# Patient Record
Sex: Male | Born: 1970 | Race: White | Hispanic: No | Smoking: Former smoker
Health system: Southern US, Community
[De-identification: ages and names within clinical notes are randomized; demographics above are authoritative.]

## PROBLEM LIST (undated history)

## (undated) DIAGNOSIS — G8929 Other chronic pain: Secondary | ICD-10-CM

## (undated) DIAGNOSIS — F32A Depression, unspecified: Secondary | ICD-10-CM

## (undated) DIAGNOSIS — F329 Major depressive disorder, single episode, unspecified: Secondary | ICD-10-CM

## (undated) DIAGNOSIS — M1990 Unspecified osteoarthritis, unspecified site: Secondary | ICD-10-CM

## (undated) DIAGNOSIS — M25559 Pain in unspecified hip: Secondary | ICD-10-CM

---

## 1998-04-12 HISTORY — PX: REFRACTIVE SURGERY: SHX103

## 2008-05-07 ENCOUNTER — Emergency Department (HOSPITAL_BASED_OUTPATIENT_CLINIC_OR_DEPARTMENT_OTHER): Admission: EM | Admit: 2008-05-07 | Discharge: 2008-05-07 | Payer: Self-pay | Admitting: Emergency Medicine

## 2012-08-28 ENCOUNTER — Emergency Department (HOSPITAL_BASED_OUTPATIENT_CLINIC_OR_DEPARTMENT_OTHER): Payer: Medicaid Other

## 2012-08-28 ENCOUNTER — Encounter (HOSPITAL_BASED_OUTPATIENT_CLINIC_OR_DEPARTMENT_OTHER): Payer: Self-pay

## 2012-08-28 ENCOUNTER — Emergency Department (HOSPITAL_BASED_OUTPATIENT_CLINIC_OR_DEPARTMENT_OTHER)
Admission: EM | Admit: 2012-08-28 | Discharge: 2012-08-28 | Disposition: A | Discharge status: Home / Self Care | Payer: Medicaid Other | Attending: Emergency Medicine | Admitting: Emergency Medicine

## 2012-08-28 DIAGNOSIS — M545 Low back pain, unspecified: Secondary | ICD-10-CM | POA: Insufficient documentation

## 2012-08-28 DIAGNOSIS — R109 Unspecified abdominal pain: Secondary | ICD-10-CM | POA: Insufficient documentation

## 2012-08-28 DIAGNOSIS — G8929 Other chronic pain: Secondary | ICD-10-CM | POA: Insufficient documentation

## 2012-08-28 DIAGNOSIS — M25559 Pain in unspecified hip: Secondary | ICD-10-CM | POA: Insufficient documentation

## 2012-08-28 DIAGNOSIS — M549 Dorsalgia, unspecified: Secondary | ICD-10-CM

## 2012-08-28 DIAGNOSIS — N390 Urinary tract infection, site not specified: Secondary | ICD-10-CM | POA: Insufficient documentation

## 2012-08-28 DIAGNOSIS — R350 Frequency of micturition: Secondary | ICD-10-CM | POA: Insufficient documentation

## 2012-08-28 HISTORY — DX: Pain in unspecified hip: M25.559

## 2012-08-28 HISTORY — DX: Other chronic pain: G89.29

## 2012-08-28 LAB — URINALYSIS, ROUTINE W REFLEX MICROSCOPIC
Bilirubin Urine: NEGATIVE
Glucose, UA: NEGATIVE mg/dL
Ketones, ur: NEGATIVE mg/dL
Leukocytes, UA: NEGATIVE
Protein, ur: NEGATIVE mg/dL
pH: 7.5 (ref 5.0–8.0)

## 2012-08-28 LAB — URINE MICROSCOPIC-ADD ON

## 2012-08-28 MED ORDER — OXYCODONE-ACETAMINOPHEN 5-325 MG PO TABS: 2.0000 | ORAL_TABLET | Freq: Three times a day (TID) | ORAL | Status: DC | PRN

## 2012-08-28 NOTE — ED Notes (Signed)
Pt reports low back pain unrelieved after taking Vicodin.  Denies injury.

## 2012-08-28 NOTE — ED Provider Notes (Signed)
History     CSN: [id number]  Arrival date & time 08/28/12  0818   First MD Initiated Contact with Patient 08/28/12 [phone]      Chief Complaint  Patient presents with  . Back Pain    (Consider location/radiation/quality/duration/timing/severity/associated sxs/prior treatment) HPI This 42 year old male has history of chronic bilateral hip pain for which he takes Vicodin but that has been stable, he now presents with a three-day history of sudden onset colicky diffuse lumbar back pain nonradiating without associated weakness numbness or change in bowel or bladder function except for some urinary frequency without hematuria or testicular pain. He also is no fever chest pain shortness breath or abdominal pain nausea vomiting or diarrhea. Treatment prior to arrival consists of 2 extra Vicodin tablets yesterday with as well as ibuprofen which did decrease the pain. The pain is sudden sharp colicky severe but not changing with position or activity. He does not have any history of fever or IV drug abuse HIV cancer or recent trauma. His pain is severe. His pain is not worse with movement or palpation. Past Medical History  Diagnosis Date  . Chronic hip pain     History reviewed. No pertinent past surgical history.  No family history on file.  History  Substance Use Topics  . Smoking status: Never Smoker   . Smokeless tobacco: Not on file  . Alcohol Use: No      Review of Systems 10 Systems reviewed and are negative for acute change except as noted in the HPI. Allergies  Etodolac  Home Medications   Current Outpatient Rx  Name  Route  Sig  Dispense  Refill  . HYDROcodone-acetaminophen (NORCO/VICODIN) 5-325 MG per tablet   Oral   Take 1 tablet by mouth every 6 (six) hours as needed for pain.         Marland Kitchen oxyCODONE-acetaminophen (PERCOCET) 5-325 MG per tablet   Oral   Take 2 tablets by mouth every 8 (eight) hours as needed for pain.   15 tablet   0     BP 162/92  Pulse 82   Temp(Src) 98.3 F (36.8 C) (Oral)  Resp 20  Ht 6\' 1"  (1.854 m)  Wt 243 lb (110.224 kg)  BMI 32.07 kg/m2  SpO2 100%  Physical Exam  Nursing note and vitals reviewed. Constitutional:  Awake, alert, nontoxic appearance with baseline speech.  HENT:  Head: Atraumatic.  Eyes: Pupils are equal, round, and reactive to light. Right eye exhibits no discharge. Left eye exhibits no discharge.  Neck: Neck supple.  Cardiovascular: Normal rate and regular rhythm.   No murmur heard. Pulmonary/Chest: Effort normal and breath sounds normal. No respiratory distress. He has no wheezes. He has no rales. He exhibits no tenderness.  Abdominal: Soft. Bowel sounds are normal. He exhibits no distension and no mass. There is no tenderness. There is no rebound and no guarding.  Genitourinary:  Testicles descended and nontender, no palpable inguinal hernias  Musculoskeletal: He exhibits no edema and no tenderness.       Thoracic back: He exhibits no tenderness.       Lumbar back: He exhibits no tenderness.  Back is nontender including no CVA tenderness and no midline tenderness and no rash. Bilateral lower extremities non tender without new rashes or color change, baseline ROM with intact DP pulses, CR<2 secs all digits bilaterally, sensation baseline light touch bilaterally for pt, DTR's symmetric and intact bilaterally KJ / AJ, motor symmetric bilateral 5 / 5 hip flexion, quadriceps, hamstrings,  EHL, foot dorsiflexion, foot plantarflexion, gait normal.  Neurological: He is alert.  Mental status baseline for patient.  Upper extremity motor strength and sensation intact and symmetric bilaterally.  Skin: No rash noted.  Psychiatric: He has a normal mood and affect.    ED Course  Procedures (including critical care time)  Labs Reviewed  URINALYSIS, ROUTINE W REFLEX MICROSCOPIC - Abnormal; Notable for the following:    Hgb urine dipstick TRACE (*)    All other components within normal limits  URINE CULTURE   URINE MICROSCOPIC-ADD ON   Ct Abdomen Pelvis Wo Contrast  08/28/2012  *RADIOLOGY REPORT*  Clinical Data: Low back pain, groin pain  CT ABDOMEN AND PELVIS WITHOUT CONTRAST  Technique:  Multidetector CT imaging of the abdomen and pelvis was performed following the standard protocol without intravenous contrast.  Comparison: None.  Findings: Lung bases are unremarkable.  Sagittal images of the spine are unremarkable.  Unenhanced liver, spleen, pancreas and adrenals are unremarkable. Unenhanced kidneys are symmetrical in size.  No nephrolithiasis. No hydronephrosis or hydroureter.  No aortic aneurysm.  No calcified gallstones are noted within gallbladder.  No calcified ureteral calculi are noted.  No small bowel obstruction.  No pericecal inflammation.  Normal appendix clearly visualized axial image 60.  Bilateral distal ureter is unremarkable.  Urinary bladder is unremarkable.  No calcified calculi are noted within urinary bladder.  Tiny prostate gland calcification measures 5 mm.  No destructive bony lesions are noted within pelvis.  No inguinal adenopathy.  IMPRESSION:  1.  No nephrolithiasis.  No hydronephrosis or hydroureter. 2.  No calcified ureteral calculi. 3.  Normal appendix. 4.  No small bowel obstruction.   Original Report Authenticated By: Natasha Mead, M.D.      1. Back pain   2. UTI (lower urinary tract infection)   3. Chronic hip pain, unspecified laterality       MDM  Patient informed of clinical course, understand medical decision-making process, and agree with plan. I doubt any other EMC precluding discharge at this time including, but not necessarily limited to the following:SBI, cauda equina.        Hurman Horn, MD 08/28/12 2053

## 2012-08-29 LAB — URINE CULTURE: Colony Count: NO GROWTH

## 2015-04-17 ENCOUNTER — Emergency Department (HOSPITAL_BASED_OUTPATIENT_CLINIC_OR_DEPARTMENT_OTHER)
Admission: EM | Admit: 2015-04-17 | Discharge: 2015-04-18 | Disposition: A | Discharge status: Home / Self Care | Payer: Medicaid Other | Attending: Emergency Medicine | Admitting: Emergency Medicine

## 2015-04-17 ENCOUNTER — Emergency Department (HOSPITAL_BASED_OUTPATIENT_CLINIC_OR_DEPARTMENT_OTHER): Payer: Medicaid Other

## 2015-04-17 ENCOUNTER — Encounter (HOSPITAL_BASED_OUTPATIENT_CLINIC_OR_DEPARTMENT_OTHER): Payer: Self-pay | Admitting: *Deleted

## 2015-04-17 DIAGNOSIS — S79911A Unspecified injury of right hip, initial encounter: Secondary | ICD-10-CM | POA: Insufficient documentation

## 2015-04-17 DIAGNOSIS — S3992XA Unspecified injury of lower back, initial encounter: Secondary | ICD-10-CM | POA: Insufficient documentation

## 2015-04-17 DIAGNOSIS — S199XXA Unspecified injury of neck, initial encounter: Secondary | ICD-10-CM | POA: Insufficient documentation

## 2015-04-17 DIAGNOSIS — Y92410 Unspecified street and highway as the place of occurrence of the external cause: Secondary | ICD-10-CM | POA: Insufficient documentation

## 2015-04-17 DIAGNOSIS — Y9389 Activity, other specified: Secondary | ICD-10-CM | POA: Insufficient documentation

## 2015-04-17 DIAGNOSIS — F17210 Nicotine dependence, cigarettes, uncomplicated: Secondary | ICD-10-CM | POA: Insufficient documentation

## 2015-04-17 DIAGNOSIS — M62830 Muscle spasm of back: Secondary | ICD-10-CM | POA: Insufficient documentation

## 2015-04-17 DIAGNOSIS — Y998 Other external cause status: Secondary | ICD-10-CM | POA: Insufficient documentation

## 2015-04-17 DIAGNOSIS — M542 Cervicalgia: Secondary | ICD-10-CM

## 2015-04-17 DIAGNOSIS — S79912A Unspecified injury of left hip, initial encounter: Secondary | ICD-10-CM | POA: Insufficient documentation

## 2015-04-17 DIAGNOSIS — G8929 Other chronic pain: Secondary | ICD-10-CM | POA: Insufficient documentation

## 2015-04-17 DIAGNOSIS — S29002A Unspecified injury of muscle and tendon of back wall of thorax, initial encounter: Secondary | ICD-10-CM | POA: Insufficient documentation

## 2015-04-17 MED ORDER — HYDROCODONE-ACETAMINOPHEN 5-325 MG PO TABS
1.0000 | ORAL_TABLET | Freq: Once | ORAL | Status: DC
  Filled 2015-04-17: qty 1

## 2015-04-17 MED ORDER — DIAZEPAM 5 MG PO TABS
10.0000 mg | ORAL_TABLET | Freq: Once | ORAL | Status: AC
  Administered 2015-04-17: 10 mg via ORAL
  Filled 2015-04-17: qty 2

## 2015-04-17 NOTE — ED Notes (Signed)
Changed into gown for possible xrays. Pt states he feels his body is locking up on him.

## 2015-04-17 NOTE — ED Notes (Signed)
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

## 2015-04-17 NOTE — ED Notes (Signed)
Came to medicate pat. Pt in xray

## 2015-04-17 NOTE — ED Provider Notes (Signed)
CSN: [id number]     Arrival date & time 04/17/15  1934 History  By signing my name below, I, Kenneth RockersMargaux Yoder, attest that this documentation has been prepared under the direction and in the presence of Kenneth BaptistEmily Yoder Kenneth Hernan, MD. Electronically Signed: [name], ED Scribe. 04/17/2015. 10:46 PM.     Chief Complaint  Patient presents with  . Neck Pain   The history is provided by the patient. No language interpreter was used.     HPI Comments: Kenneth Yoder is a 44 y.o. male who presents to the Emergency Department complaining of sudden onset, constant, posterior neck pain that occurred earlier today approximately 5 hours ago.. 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 Pt thinks he may have had LOC but is not sure. He also complains of upper back pain, lower back pain, and bilateral hip pain. Pt has been eating small amounts since onset. Denies nausea, vomiting, numbness, weakness, tingling, urinary or bowel incontinence, or any other associated symptoms. He is not currently on any anticoagulants.   Past Medical History  Diagnosis Date  . Chronic hip pain    History reviewed. No pertinent past surgical history. No family history on file. Social History  Substance Use Topics  . Smoking status: Current Every Day Smoker -- 0.50 packs/day    Types: Cigarettes  . Smokeless tobacco: None  . Alcohol Use: No    Review of Systems  Gastrointestinal: Negative for nausea and vomiting.       Negative for bowel incontinence  Genitourinary:       Negative for urinary incontinence  Musculoskeletal: Positive for back pain, arthralgias (Bilateral hip pain) and neck pain.  Neurological: Negative for weakness and numbness.  All other systems reviewed and are negative.  Allergies  Etodolac  Home Medications   Prior to Admission medications   Medication Sig Start Date End Date Taking? Authorizing  Provider  HYDROcodone-acetaminophen (NORCO/VICODIN) 5-325 MG per tablet Take 1 tablet by mouth every 6 (six) hours as needed for pain.    Historical Provider, MD  oxyCODONE-acetaminophen (PERCOCET) 5-325 MG per tablet Take 2 tablets by mouth every 8 (eight) hours as needed for pain. 08/28/12   Wayland SalinasJohn Bednar, MD   Triage VItals: BP 143/110 mmHg  Pulse 73  Temp(Src) 98.3 F (36.8 C) (Oral)  Resp 18  Ht 6\' 1"  (1.854 m)  Wt 245 lb (111.131 kg)  BMI 32.33 kg/m2  SpO2 99%   Physical Exam  Constitutional: He is oriented to person, place, and time. He appears well-developed and well-nourished. No distress.  HENT:  Head: Normocephalic and atraumatic.  Right Ear: External ear normal.  Left Ear: External ear normal.  Mouth/Throat: Oropharynx is clear and moist. No oropharyngeal exudate.  Eyes: Conjunctivae and EOM are normal. Pupils are equal, round, and reactive to light.  Neck: Neck supple. Muscular tenderness present. No spinous process tenderness present. No rigidity. No tracheal deviation, no edema and no erythema present.  Cardiovascular: Normal rate.   Pulmonary/Chest: Effort normal. No respiratory distress.  Musculoskeletal: Normal range of motion. He exhibits no edema or tenderness.       Thoracic back: He exhibits pain and spasm. He exhibits normal range of motion, no tenderness, no swelling and no edema.       Lumbar back: He exhibits pain and spasm. He exhibits normal range of motion, no tenderness, no bony tenderness, no edema and  no deformity.  Neurological: He is alert and oriented to person, place, and time. He has normal strength. No cranial nerve deficit or sensory deficit. He exhibits normal muscle tone. Coordination and gait normal.  Patient able to stand and ambulate without difficulty.  Skin: Skin is warm and dry.  Psychiatric: He has a normal mood and affect. His behavior is normal.  Nursing note and vitals reviewed.   ED Course  Procedures (including critical care  time)  DIAGNOSTIC STUDIES: Oxygen Saturation is 99% on RA, normal by my interpretation.    COORDINATION OF CARE: 10:45 PM-Discussed treatment plan which includes CT C Spine with pt at bedside and pt agreed to plan.   Labs Review Labs Reviewed - No data to display  Imaging Review No results found. I have personally reviewed and evaluated these images as part of my medical decision-making.   EKG Interpretation None      MDM  PAtient seen and evaluated in stable condition.  Given Norco and Valium for symptom control.  CT cervical spine and xrays of the thoracic and lumbar spine unremarkable.  Patient updated on results.  He was discharged home in stable condition with strict return precautions and instruction to follow up with his PCP.  All questions answered prior to discharge. Final diagnoses:  None  1. Cervical sprain  2. Muscle spasm  I personally performed the services described in this documentation, which was scribed in my presence. The recorded information has been reviewed and is accurate.     Kenneth Baptist, MD 04/18/15 [phone]

## 2015-04-18 MED ORDER — OXYCODONE-ACETAMINOPHEN 5-325 MG PO TABS: 1.0000 | ORAL_TABLET | Freq: Four times a day (QID) | ORAL | Status: DC | PRN

## 2015-04-18 MED ORDER — DIAZEPAM 5 MG PO TABS: 5.0000 mg | ORAL_TABLET | Freq: Four times a day (QID) | ORAL | Status: DC | PRN

## 2015-04-18 NOTE — Discharge Instructions (Signed)
Cervical Sprain A cervical sprain is an injury in the neck in which the strong, fibrous tissues (ligaments) that connect your neck bones stretch or tear. Cervical sprains can range from mild to severe.  The amount of time it takes for a cervical sprain to get better depends on the cause and extent of the injury. Most cervical sprains heal in 1 to 3 weeks. CAUSES  Severe cervical sprains may be caused by:   Contact sport injuries (such as from football, rugby, wrestling, hockey, auto racing, gymnastics, diving, martial arts, or boxing).   Motor vehicle collisions.   Whiplash injuries. This is an injury from a sudden forward and backward whipping movement of the head and neck.  Falls.  Mild cervical sprains may be caused by:   Being in an awkward position, such as while cradling a telephone between your ear and shoulder.   Sitting in a chair that does not offer proper support.   Working at a poorly Marketing executive station.   Looking up or down for long periods of time.  SYMPTOMS   Pain, soreness, stiffness, or a burning sensation in the front, back, or sides of the neck. This discomfort may develop immediately after the injury or slowly, 24 hours or more after the injury.   Pain or tenderness directly in the middle of the back of the neck.   Shoulder or upper back pain.   Limited ability to move the neck.   Headache.   Dizziness.   Weakness, numbness, or tingling in the hands or arms.   Muscle spasms.   Difficulty swallowing or chewing.   Tenderness and swelling of the neck.  DIAGNOSIS  Most of the time your health care provider can diagnose a cervical sprain by taking your history and doing a physical exam. Your health care provider will ask about previous neck injuries and any known neck problems, such as arthritis in the neck. X-rays may be taken to find out if there are any other problems, such as with the bones of the neck. Other tests, such as a CT  scan or MRI, may also be needed.  TREATMENT  Treatment depends on the severity of the cervical sprain. Mild sprains can be treated with rest, keeping the neck in place (immobilization), and pain medicines. Severe cervical sprains are immediately immobilized. Further treatment is done to help with pain, muscle spasms, and other symptoms and may include:  Medicines, such as pain relievers, numbing medicines, or muscle relaxants.   Physical therapy. This may involve stretching exercises, strengthening exercises, and posture training. Exercises and improved posture can help stabilize the neck, strengthen muscles, and help stop symptoms from returning.  HOME CARE INSTRUCTIONS   Put ice on the injured area.   Put ice in a plastic bag.   Place a towel between your skin and the bag.   Leave the ice on for 15-20 minutes, 3-4 times a day.   If your injury was severe, you may have been given a cervical collar to wear. A cervical collar is a two-piece collar designed to keep your neck from moving while it heals.  Do not remove the collar unless instructed by your health care provider.  If you have long hair, keep it outside of the collar.  Ask your health care provider before making any adjustments to your collar. Minor adjustments may be required over time to improve comfort and reduce pressure on your chin or on the back of your head.  Ifyou are allowed to  remove the collar for cleaning or bathing, follow your health care provider's instructions on how to do so safely.  Keep your collar clean by wiping it with mild soap and water and drying it completely. If the collar you have been given includes removable pads, remove them every 1-2 days and hand wash them with soap and water. Allow them to air dry. They should be completely dry before you wear them in the collar.  If you are allowed to remove the collar for cleaning and bathing, wash and dry the skin of your neck. Check your skin for  irritation or sores. If you see any, tell your health care provider.  Do not drive while wearing the collar.   Only take over-the-counter or prescription medicines for pain, discomfort, or fever as directed by your health care provider.   Keep all follow-up appointments as directed by your health care provider.   Keep all physical therapy appointments as directed by your health care provider.   Make any needed adjustments to your workstation to promote good posture.   Avoid positions and activities that make your symptoms worse.   Warm up and stretch before being active to help prevent problems.  SEEK MEDICAL CARE IF:   Your pain is not controlled with medicine.   You are unable to decrease your pain medicine over time as planned.   Your activity level is not improving as expected.  SEEK IMMEDIATE MEDICAL CARE IF:   You develop any bleeding.  You develop stomach upset.  You have signs of an allergic reaction to your medicine.   Your symptoms get worse.   You develop new, unexplained symptoms.   You have numbness, tingling, weakness, or paralysis in any part of your body.  MAKE SURE YOU:   Understand these instructions.  Will watch your condition.  Will get help right away if you are not doing well or get worse.   This information is not intended to replace advice given to you by your health care provider. Make sure you discuss any questions you have with your health care provider.   Document Released: 02/24/2007 Document Revised: 05/04/2013 Document Reviewed: 11/04/2012 Elsevier Interactive Patient Education [employer]2016 [employer]

## 2017-03-11 ENCOUNTER — Other Ambulatory Visit: Payer: Self-pay | Admitting: Orthopedic Surgery

## 2017-03-11 DIAGNOSIS — M1611 Unilateral primary osteoarthritis, right hip: Secondary | ICD-10-CM

## 2017-03-27 ENCOUNTER — Ambulatory Visit
Admission: RE | Admit: 2017-03-27 | Discharge: 2017-03-27 | Disposition: A | Discharge status: Home / Self Care | Payer: Non-veteran care | Source: Ambulatory Visit | Attending: Orthopedic Surgery | Admitting: Orthopedic Surgery

## 2017-03-27 ENCOUNTER — Ambulatory Visit
Admission: RE | Admit: 2017-03-27 | Discharge: 2017-03-27 | Disposition: A | Discharge status: Home / Self Care | Payer: Worker's Compensation | Source: Ambulatory Visit | Attending: Orthopedic Surgery | Admitting: Orthopedic Surgery

## 2017-03-27 DIAGNOSIS — M1611 Unilateral primary osteoarthritis, right hip: Secondary | ICD-10-CM

## 2017-03-27 MED ORDER — IOPAMIDOL (ISOVUE-M 200) INJECTION 41%
12.0000 mL | Freq: Once | INTRAMUSCULAR | Status: AC
  Administered 2017-03-27: 12 mL via INTRA_ARTICULAR

## 2017-06-05 NOTE — Progress Notes (Signed)
Please place orders in Epic as patient is being scheduled for a pre-op appointment! Thank you! 

## 2017-06-27 NOTE — H&P (Signed)
TOTAL HIP ADMISSION H&P  Patient is admitted for right total hip arthroplasty, anterior approach .  Subjective:  Chief Complaint: Right hip primary OA / pain  HPI: Kenneth Yoder, 47 y.o. male, has a history of pain and functional disability in the right hip(s) due to arthritis and patient has failed non-surgical conservative treatments for greater than 12 weeks to include NSAID's and/or analgesics, corticosteriod injections, use of assistive devices and activity modification.  Onset of symptoms was gradual starting >10 years ago with gradually worsening course since that time.The patient noted no past surgery on the right hip(s).  Patient currently rates pain in the right hip at 10 out of 10 with activity. Patient has night pain, worsening of pain with activity and weight bearing, trendelenberg gait, pain that interfers with activities of daily living and pain with passive range of motion. Patient has evidence of periarticular osteophytes and joint space narrowing by imaging studies. This condition presents safety issues increasing the risk of falls.  There is no current active infection.  Risks, benefits and expectations were discussed with the patient.  Risks including but not limited to the risk of anesthesia, blood clots, nerve damage, blood vessel damage, failure of the prosthesis, infection and up to and including death.  Patient understand the risks, benefits and expectations and wishes to proceed with surgery.   PCP: Patient, No Pcp Per  D/C Plans:       Home  Post-op Meds:       No Rx given   Tranexamic Acid:      To be given - IV   Decadron:      Is to be given  FYI:      ASA  Oxycodone  DME:   Pt already has equipment  PT:   No PT     Past Medical History:  Diagnosis Date  . Chronic hip pain   . Depression     Past Surgical History:  Procedure Laterality Date  . REFRACTIVE SURGERY  04/1998    No current facility-administered medications for this encounter.     Current Outpatient Medications  Medication Sig Dispense Refill Last Dose  . aspirin-acetaminophen-caffeine (EXCEDRIN MIGRAINE) 250-250-65 MG tablet Take 1 tablet by mouth daily as needed for headache.     . Menthol, Topical Analgesic, (MINERAL ICE EX) Apply 1 application topically daily as needed (pain).      Allergies  Allergen Reactions  . Etodolac     Bloody stools    Social History   Tobacco Use  . Smoking status: Former Smoker    Packs/day: 0.50    Types: Cigarettes    Last attempt to quit: 09/2016    Years since quitting: 0.8  . Smokeless tobacco: Never Used  Substance Use Topics  . Alcohol use: No       Review of Systems  Constitutional: Negative.   HENT: Negative.   Eyes: Negative.   Respiratory: Negative.   Cardiovascular: Negative.   Gastrointestinal: Negative.   Genitourinary: Negative.   Musculoskeletal: Positive for joint pain.  Skin: Negative.   Neurological: Negative.   Endo/Heme/Allergies: Negative.   Psychiatric/Behavioral: Positive for depression.    Objective:  Physical Exam  Constitutional: He is oriented to person, place, and time. He appears well-developed.  HENT:  Head: Normocephalic.  Eyes: Pupils are equal, round, and reactive to light.  Neck: Neck supple. No JVD present. No tracheal deviation present. No thyromegaly present.  Cardiovascular: Normal rate, regular rhythm and intact distal pulses.  Respiratory: Effort  normal and breath sounds normal. No respiratory distress. He has no wheezes.  GI: Soft. There is no tenderness. There is no guarding.  Musculoskeletal:       Right hip: He exhibits decreased range of motion, decreased strength, tenderness and bony tenderness. He exhibits no swelling and no laceration.  Lymphadenopathy:    He has no cervical adenopathy.  Neurological: He is alert and oriented to person, place, and time.  Skin: Skin is warm and dry.  Psychiatric: He has a normal mood and affect.       Labs:  Estimated body mass index is 31.8 kg/m as calculated from the following:   Height as of 07/03/17: 6\' 1"  (1.854 m).   Weight as of 07/03/17: 109.3 kg (241 lb).   Imaging Review Plain radiographs demonstrate severe degenerative joint disease of the right hip(s). The bone quality appears to be good for age and reported activity level.  Assessment/Plan:  End stage arthritis, right hip(s)  The patient history, physical examination, clinical judgement of the provider and imaging studies are consistent with end stage degenerative joint disease of the right hip(s) and total hip arthroplasty is deemed medically necessary. The treatment options including medical management, injection therapy, arthroscopy and arthroplasty were discussed at length. The risks and benefits of total hip arthroplasty were presented and reviewed. The risks due to aseptic loosening, infection, stiffness, dislocation/subluxation,  thromboembolic complications and other imponderables were discussed.  The patient acknowledged the explanation, agreed to proceed with the plan and consent was signed. Patient is being admitted for inpatient treatment for surgery, pain control, PT, OT, prophylactic antibiotics, VTE prophylaxis, progressive ambulation and ADL's and discharge planning.The patient is planning to be discharged home.       Anastasio AuerbachMatthew S. Armend Hochstatter   PA-C  07/07/2017, 10:01 AM

## 2017-07-02 NOTE — Progress Notes (Signed)
03-26-17 Surgical clearance on chart

## 2017-07-02 NOTE — Patient Instructions (Signed)
Kenneth Yoder  07/02/2017   Your procedure is scheduled on: 07-08-17   Report to [facility] Main  Entrance Follow signs to Short Stay on first floor at 530 AM    Call this number if you have problems the morning of surgery [phone]   Remember: Do not eat food or drink liquids :After Midnight.     Take these medicines the morning of surgery with A SIP OF WATER: None                                 You may not have any metal on your body including hair pins and              piercings  Do not wear jewelry, lotions, powders or deodorant             Men may shave face and neck.   Do not bring valuables to the hospital. [facility] IS NOT             RESPONSIBLE   FOR VALUABLES.  Contacts, dentures or bridgework may not be worn into surgery.  Leave suitcase in the car. After surgery it may be brought to your room.                Please read over the following fact sheets you were given: _____________________________________________________________________           [facility] - Preparing [facility] Before surgery, you can play an important role.  Because skin is not sterile, your skin needs to be as free of germs as possible.  You can reduce the number of germs on your skin by washing with CHG (chlorahexidine gluconate) soap before surgery.  CHG is an antiseptic cleaner which kills germs and bonds with the skin to continue killing germs even after washing. Please DO NOT use if you have an allergy to CHG or antibacterial soaps.  If your skin becomes reddened/irritated stop using the CHG and inform your nurse when you arrive at Short Stay. Do not shave (including legs and underarms) for at least 48 hours prior to the first CHG shower.  You may shave your face/neck. Please follow these instructions carefully:  1.  Shower with CHG Soap the night before surgery and the  morning of Surgery.  2.  If you choose to wash your hair, wash your hair first as usual with  your  normal  shampoo.  3.  After you shampoo, rinse your hair and body thoroughly to remove the  shampoo.                           4.  Use CHG as you would any other liquid soap.  You can apply chg directly  to the skin and wash                       Gently with a scrungie or clean washcloth.  5.  Apply the CHG Soap to your body ONLY FROM THE NECK DOWN.   Do not use on face/ open                           Wound or open sores. Avoid contact with eyes, ears mouth and genitals (  private parts).                       Wash face,  Genitals (private parts) with your normal soap.             6.  Wash thoroughly, paying special attention to the area where your surgery  will be performed.  7.  Thoroughly rinse your body with warm water from the neck down.  8.  DO NOT shower/wash with your normal soap after using and rinsing off  the CHG Soap.                9.  Pat yourself dry with a clean towel.            10.  Wear clean pajamas.            11.  Place clean sheets on your bed the night of your first shower and do not  sleep with pets. Day of Surgery : Do not apply any lotions/deodorants the morning of surgery.  Please wear clean clothes to the hospital/surgery center.  FAILURE TO FOLLOW THESE INSTRUCTIONS MAY RESULT IN THE CANCELLATION OF YOUR SURGERY PATIENT SIGNATURE_________________________________  NURSE SIGNATURE__________________________________  ________________________________________________________________________   Kenneth Yoder  An incentive spirometer is a tool that can help keep your lungs clear and active. This tool measures how well you are filling your lungs with each breath. Taking long deep breaths may help reverse or decrease the chance of developing breathing (pulmonary) problems (especially infection) following:  A long period of time when you are unable to move or be active. BEFORE THE PROCEDURE   If the spirometer includes an indicator to show your best effort,  your nurse or respiratory therapist will set it to a desired goal.  If possible, sit up straight or lean slightly forward. Try not to slouch.  Hold the incentive spirometer in an upright position. INSTRUCTIONS FOR USE  1. Sit on the edge of your bed if possible, or sit up as far as you can in bed or on a chair. 2. Hold the incentive spirometer in an upright position. 3. Breathe out normally. 4. Place the mouthpiece in your mouth and seal your lips tightly around it. 5. Breathe in slowly and as deeply as possible, raising the piston or the ball toward the top of the column. 6. Hold your breath for 3-5 seconds or for as long as possible. Allow the piston or ball to fall to the bottom of the column. 7. Remove the mouthpiece from your mouth and breathe out normally. 8. Rest for a few seconds and repeat Steps 1 through 7 at least 10 times every 1-2 hours when you are awake. Take your time and take a few normal breaths between deep breaths. 9. The spirometer may include an indicator to show your best effort. Use the indicator as a goal to work toward during each repetition. 10. After each set of 10 deep breaths, practice coughing to be sure your lungs are clear. If you have an incision (the cut made at the time of surgery), support your incision when coughing by placing a pillow or rolled up towels firmly against it. Once you are able to get out of bed, walk around indoors and cough well. You may stop using the incentive spirometer when instructed by your caregiver.  RISKS AND COMPLICATIONS  Take your time so you do not get dizzy or light-headed.  If you are in pain, you may need  to take or ask for pain medication before doing incentive spirometry. It is harder to take a deep breath if you are having pain. AFTER USE  Rest and breathe slowly and easily.  It can be helpful to keep track of a log of your progress. Your caregiver can provide you with a simple table to help with this. If you are  using the spirometer at home, follow these instructions: SEEK MEDICAL CARE IF:   You are having difficultly using the spirometer.  You have trouble using the spirometer as often as instructed.  Your pain medication is not giving enough relief while using the spirometer.  You develop fever of 100.5 F (38.1 C) or higher. SEEK IMMEDIATE MEDICAL CARE IF:   You cough up bloody sputum that had not been present before.  You develop fever of 102 F (38.9 C) or greater.  You develop worsening pain at or near the incision site. MAKE SURE YOU:   Understand these instructions.  Will watch your condition.  Will get help right away if you are not doing well or get worse. Document Released: 09/09/2006 Document Revised: 07/22/2011 Document Reviewed: 11/10/2006 Exit[employer] Patient Information 2014 Exit[employer], [place].   ________________________________________________________________________  WHAT IS A BLOOD TRANSFUSION? Blood Transfusion Information  A transfusion is the replacement of blood or some of its parts. Blood is made up of multiple cells which provide different functions.  Red blood cells carry oxygen and are used for blood loss replacement.  White blood cells fight against infection.  Platelets control bleeding.  Plasma helps clot blood.  Other blood products are available for specialized needs, such as hemophilia or other clotting disorders. BEFORE THE TRANSFUSION  Who gives blood for transfusions?   Healthy volunteers who are fully evaluated to make sure their blood is safe. This is blood bank blood. Transfusion therapy is the safest it has ever been in the practice of medicine. Before blood is taken from a donor, a complete history is taken to make sure that person has no history of diseases nor engages in risky social behavior (examples are intravenous drug use or sexual activity with multiple partners). The donor's travel history is screened to minimize risk of transmitting  infections, such as malaria. The donated blood is tested for signs of infectious diseases, such as HIV and hepatitis. The blood is then tested to be sure it is compatible with you in order to minimize the chance of a transfusion reaction. If you or a relative donates blood, this is often done in anticipation of surgery and is not appropriate for emergency situations. It takes many days to process the donated blood. RISKS AND COMPLICATIONS Although transfusion therapy is very safe and saves many lives, the main dangers of transfusion include:   Getting an infectious disease.  Developing a transfusion reaction. This is an allergic reaction to something in the blood you were given. Every precaution is taken to prevent this. The decision to have a blood transfusion has been considered carefully by your caregiver before blood is given. Blood is not given unless the benefits outweigh the risks. AFTER THE TRANSFUSION  Right after receiving a blood transfusion, you will usually feel much better and more energetic. This is especially true if your red blood cells have gotten low (anemic). The transfusion raises the level of the red blood cells which carry oxygen, and this usually causes an energy increase.  The nurse administering the transfusion will monitor you carefully for complications. HOME CARE INSTRUCTIONS  No special instructions  are needed after a transfusion. You may find your energy is better. Speak with your caregiver about any limitations on activity for underlying diseases you may have. SEEK MEDICAL CARE IF:   Your condition is not improving after your transfusion.  You develop redness or irritation at the intravenous (IV) site. SEEK IMMEDIATE MEDICAL CARE IF:  Any of the following symptoms occur over the next 12 hours:  Shaking chills.  You have a temperature by mouth above 102 F (38.9 C), not controlled by medicine.  Chest, back, or muscle pain.  People around you feel you are  not acting correctly or are confused.  Shortness of breath or difficulty breathing.  Dizziness and fainting.  You get a rash or develop hives.  You have a decrease in urine output.  Your urine turns a dark color or changes to pink, red, or brown. Any of the following symptoms occur over the next 10 days:  You have a temperature by mouth above 102 F (38.9 C), not controlled by medicine.  Shortness of breath.  Weakness after normal activity.  The white part of the eye turns yellow (jaundice).  You have a decrease in the amount of urine or are urinating less often.  Your urine turns a dark color or changes to pink, red, or brown. Document Released: 04/26/2000 Document Revised: 07/22/2011 Document Reviewed: 12/14/2007 [facility] Patient Information 2014 [place], [place].  _______________________________________________________________________

## 2017-07-03 ENCOUNTER — Encounter (HOSPITAL_COMMUNITY): Payer: Self-pay

## 2017-07-03 ENCOUNTER — Other Ambulatory Visit: Payer: Self-pay

## 2017-07-03 ENCOUNTER — Encounter (HOSPITAL_COMMUNITY)
Admission: RE | Admit: 2017-07-03 | Discharge: 2017-07-03 | Disposition: A | Discharge status: Home / Self Care | Payer: Non-veteran care | Source: Ambulatory Visit | Attending: Orthopedic Surgery | Admitting: Orthopedic Surgery

## 2017-07-03 DIAGNOSIS — Z01812 Encounter for preprocedural laboratory examination: Secondary | ICD-10-CM | POA: Insufficient documentation

## 2017-07-03 HISTORY — DX: Depression, unspecified: F32.A

## 2017-07-03 HISTORY — DX: Major depressive disorder, single episode, unspecified: F32.9

## 2017-07-03 LAB — BASIC METABOLIC PANEL
ANION GAP: 10 (ref 5–15)
BUN: 14 mg/dL (ref 6–20)
CO2: 23 mmol/L (ref 22–32)
Calcium: 9.4 mg/dL (ref 8.9–10.3)
Chloride: 105 mmol/L (ref 101–111)
Creatinine, Ser: 0.97 mg/dL (ref 0.61–1.24)
GFR calc Af Amer: >60 mL/min (ref >60)
GFR calc non Af Amer: >60 mL/min (ref >60)
GLUCOSE: 107 mg/dL — AB (ref 65–99)
POTASSIUM: 4.4 mmol/L (ref 3.5–5.1)
Sodium: 138 mmol/L (ref 135–145)

## 2017-07-03 LAB — CBC
HEMATOCRIT: 45.9 % (ref 39.0–52.0)
Hemoglobin: 16 g/dL (ref 13.0–17.0)
MCH: 32.6 pg (ref 26.0–34.0)
MCHC: 34.9 g/dL (ref 30.0–36.0)
MCV: 93.5 fL (ref 78.0–100.0)
Platelets: 288 10*3/uL (ref 150–400)
RBC: 4.91 MIL/uL (ref 4.22–5.81)
RDW: 12.1 % (ref 11.5–15.5)
WBC: 6.6 10*3/uL (ref 4.0–10.5)

## 2017-07-03 LAB — ABO/RH: ABO/RH(D): O NEG

## 2017-07-03 LAB — SURGICAL PCR SCREEN
MRSA, PCR: NEGATIVE
Staphylococcus aureus: NEGATIVE

## 2017-07-07 MED ORDER — TRANEXAMIC ACID 1000 MG/10ML IV SOLN
1000.0000 mg | INTRAVENOUS | Status: AC
  Administered 2017-07-08: 1000 mg via INTRAVENOUS
  Filled 2017-07-07: qty 1100

## 2017-07-08 ENCOUNTER — Inpatient Hospital Stay (HOSPITAL_COMMUNITY): Payer: Non-veteran care | Admitting: Certified Registered Nurse Anesthetist

## 2017-07-08 ENCOUNTER — Inpatient Hospital Stay (HOSPITAL_COMMUNITY): Payer: Non-veteran care

## 2017-07-08 ENCOUNTER — Encounter (HOSPITAL_COMMUNITY)
Admission: RE | Disposition: A | Discharge status: Home / Self Care | Payer: Self-pay | Source: Ambulatory Visit | Attending: Orthopedic Surgery

## 2017-07-08 ENCOUNTER — Other Ambulatory Visit: Payer: Self-pay

## 2017-07-08 ENCOUNTER — Inpatient Hospital Stay (HOSPITAL_COMMUNITY)
Admission: RE | Admit: 2017-07-08 | Discharge: 2017-07-09 | DRG: 470 | Disposition: A | Discharge status: Home / Self Care | Payer: Non-veteran care | Source: Ambulatory Visit | Attending: Orthopedic Surgery | Admitting: Orthopedic Surgery

## 2017-07-08 ENCOUNTER — Encounter (HOSPITAL_COMMUNITY): Payer: Self-pay | Admitting: Emergency Medicine

## 2017-07-08 DIAGNOSIS — Z6831 Body mass index (BMI) 31.0-31.9, adult: Secondary | ICD-10-CM | POA: Diagnosis not present

## 2017-07-08 DIAGNOSIS — Z87891 Personal history of nicotine dependence: Secondary | ICD-10-CM

## 2017-07-08 DIAGNOSIS — Z96649 Presence of unspecified artificial hip joint: Secondary | ICD-10-CM

## 2017-07-08 DIAGNOSIS — M1611 Unilateral primary osteoarthritis, right hip: Secondary | ICD-10-CM | POA: Diagnosis present

## 2017-07-08 DIAGNOSIS — F329 Major depressive disorder, single episode, unspecified: Secondary | ICD-10-CM | POA: Diagnosis present

## 2017-07-08 DIAGNOSIS — E669 Obesity, unspecified: Secondary | ICD-10-CM | POA: Diagnosis present

## 2017-07-08 DIAGNOSIS — Z96641 Presence of right artificial hip joint: Secondary | ICD-10-CM

## 2017-07-08 HISTORY — PX: TOTAL HIP ARTHROPLASTY: SHX124

## 2017-07-08 LAB — TYPE AND SCREEN
ABO/RH(D): O NEG
ANTIBODY SCREEN: NEGATIVE

## 2017-07-08 SURGERY — ARTHROPLASTY, HIP, TOTAL, ANTERIOR APPROACH
Anesthesia: Spinal | Site: Hip | Laterality: Right

## 2017-07-08 MED ORDER — PROPOFOL 500 MG/50ML IV EMUL
INTRAVENOUS | Status: DC | PRN
  Administered 2017-07-08: 100 ug/kg/min via INTRAVENOUS

## 2017-07-08 MED ORDER — FENTANYL CITRATE (PF) 100 MCG/2ML IJ SOLN
INTRAMUSCULAR | Status: AC
  Filled 2017-07-08: qty 2

## 2017-07-08 MED ORDER — CEFAZOLIN SODIUM-DEXTROSE 2-4 GM/100ML-% IV SOLN
2.0000 g | Freq: Four times a day (QID) | INTRAVENOUS | Status: AC
  Administered 2017-07-08 (×2): 2 g via INTRAVENOUS
  Filled 2017-07-08 (×2): qty 100

## 2017-07-08 MED ORDER — METOCLOPRAMIDE HCL 5 MG PO TABS: 5.0000 mg | ORAL_TABLET | Freq: Three times a day (TID) | ORAL | Status: DC | PRN

## 2017-07-08 MED ORDER — DEXAMETHASONE SODIUM PHOSPHATE 10 MG/ML IJ SOLN
10.0000 mg | Freq: Once | INTRAMUSCULAR | Status: AC
Start: 2017-07-08 — End: 2017-07-08
  Administered 2017-07-08: 10 mg via INTRAVENOUS

## 2017-07-08 MED ORDER — ONDANSETRON HCL 4 MG/2ML IJ SOLN
INTRAMUSCULAR | Status: AC
  Filled 2017-07-08: qty 2

## 2017-07-08 MED ORDER — OXYCODONE HCL 5 MG PO TABS
ORAL_TABLET | ORAL | Status: AC
  Administered 2017-07-08: 10 mg via ORAL
  Filled 2017-07-08: qty 2

## 2017-07-08 MED ORDER — OXYCODONE HCL 5 MG PO TABS: 5.0000 mg | ORAL_TABLET | ORAL | Status: DC | PRN

## 2017-07-08 MED ORDER — METHOCARBAMOL 500 MG PO TABS
500.0000 mg | ORAL_TABLET | Freq: Four times a day (QID) | ORAL | Status: DC | PRN
  Administered 2017-07-09: 500 mg via ORAL
  Filled 2017-07-08: qty 1

## 2017-07-08 MED ORDER — ALUM & MAG HYDROXIDE-SIMETH 200-200-20 MG/5ML PO SUSP: 15.0000 mL | ORAL | Status: DC | PRN

## 2017-07-08 MED ORDER — BUPIVACAINE IN DEXTROSE 0.75-8.25 % IT SOLN
INTRATHECAL | Status: DC | PRN
  Administered 2017-07-08: 2 mL via INTRATHECAL

## 2017-07-08 MED ORDER — ONDANSETRON HCL 4 MG PO TABS: 4.0000 mg | ORAL_TABLET | Freq: Three times a day (TID) | ORAL | Status: DC | PRN

## 2017-07-08 MED ORDER — DEXAMETHASONE SODIUM PHOSPHATE 10 MG/ML IJ SOLN
10.0000 mg | Freq: Once | INTRAMUSCULAR | Status: AC
  Administered 2017-07-09: 10 mg via INTRAVENOUS
  Filled 2017-07-08: qty 1

## 2017-07-08 MED ORDER — MIDAZOLAM HCL 5 MG/5ML IJ SOLN
INTRAMUSCULAR | Status: DC | PRN
  Administered 2017-07-08: 2 mg via INTRAVENOUS

## 2017-07-08 MED ORDER — DIPHENHYDRAMINE HCL 12.5 MG/5ML PO ELIX: 12.5000 mg | ORAL_SOLUTION | ORAL | Status: DC | PRN

## 2017-07-08 MED ORDER — DOCUSATE SODIUM 100 MG PO CAPS
100.0000 mg | ORAL_CAPSULE | Freq: Two times a day (BID) | ORAL | Status: DC
  Administered 2017-07-08 – 2017-07-09 (×2): 100 mg via ORAL
  Filled 2017-07-08 (×2): qty 1

## 2017-07-08 MED ORDER — PROPOFOL 10 MG/ML IV BOLUS
INTRAVENOUS | Status: AC
  Filled 2017-07-08: qty 20

## 2017-07-08 MED ORDER — BISACODYL 10 MG RE SUPP: 10.0000 mg | Freq: Every day | RECTAL | Status: DC | PRN

## 2017-07-08 MED ORDER — CEFAZOLIN SODIUM-DEXTROSE 2-4 GM/100ML-% IV SOLN
2.0000 g | INTRAVENOUS | Status: AC
  Administered 2017-07-08: 2 g via INTRAVENOUS
  Filled 2017-07-08: qty 100

## 2017-07-08 MED ORDER — DOCUSATE SODIUM 100 MG PO CAPS: 100.0000 mg | ORAL_CAPSULE | Freq: Two times a day (BID) | ORAL | 0 refills | Status: DC

## 2017-07-08 MED ORDER — DEXAMETHASONE SODIUM PHOSPHATE 10 MG/ML IJ SOLN
INTRAMUSCULAR | Status: AC
  Filled 2017-07-08: qty 1

## 2017-07-08 MED ORDER — CEFAZOLIN SODIUM-DEXTROSE 2-4 GM/100ML-% IV SOLN: 2.0000 g | INTRAVENOUS | Status: DC

## 2017-07-08 MED ORDER — ACETAMINOPHEN 500 MG PO TABS: 1000.0000 mg | ORAL_TABLET | Freq: Three times a day (TID) | ORAL | 0 refills | Status: DC

## 2017-07-08 MED ORDER — PROPOFOL 10 MG/ML IV BOLUS
INTRAVENOUS | Status: AC
  Filled 2017-07-08: qty 60

## 2017-07-08 MED ORDER — SODIUM CHLORIDE 0.9 % IR SOLN
Status: DC | PRN
  Administered 2017-07-08: 1000 mL

## 2017-07-08 MED ORDER — MAGNESIUM CITRATE PO SOLN: 1.0000 | Freq: Once | ORAL | Status: DC | PRN

## 2017-07-08 MED ORDER — LACTATED RINGERS IV SOLN
INTRAVENOUS | Status: DC | PRN
  Administered 2017-07-08 (×2): via INTRAVENOUS

## 2017-07-08 MED ORDER — HYDROMORPHONE HCL 1 MG/ML IJ SOLN
INTRAMUSCULAR | Status: AC
  Administered 2017-07-08: 0.25 mg via INTRAVENOUS
  Filled 2017-07-08: qty 1

## 2017-07-08 MED ORDER — SODIUM CHLORIDE 0.9 % IV SOLN
INTRAVENOUS | Status: DC
  Administered 2017-07-08 (×2): via INTRAVENOUS

## 2017-07-08 MED ORDER — ASPIRIN 81 MG PO CHEW: 81.0000 mg | CHEWABLE_TABLET | Freq: Two times a day (BID) | ORAL | 0 refills | Status: AC

## 2017-07-08 MED ORDER — OXYCODONE HCL 5 MG PO TABS
10.0000 mg | ORAL_TABLET | ORAL | Status: DC | PRN
  Administered 2017-07-08 – 2017-07-09 (×4): 10 mg via ORAL
  Filled 2017-07-08 (×3): qty 2

## 2017-07-08 MED ORDER — METHOCARBAMOL 500 MG PO TABS: 500.0000 mg | ORAL_TABLET | Freq: Four times a day (QID) | ORAL | 0 refills | Status: DC | PRN

## 2017-07-08 MED ORDER — OXYCODONE HCL 5 MG PO TABS: 5.0000 mg | ORAL_TABLET | ORAL | 0 refills | Status: DC | PRN

## 2017-07-08 MED ORDER — PROMETHAZINE HCL 25 MG/ML IJ SOLN: 6.2500 mg | INTRAMUSCULAR | Status: DC | PRN

## 2017-07-08 MED ORDER — MENTHOL 3 MG MT LOZG: 1.0000 | LOZENGE | OROMUCOSAL | Status: DC | PRN

## 2017-07-08 MED ORDER — METHOCARBAMOL 1000 MG/10ML IJ SOLN
500.0000 mg | Freq: Four times a day (QID) | INTRAVENOUS | Status: DC | PRN
  Administered 2017-07-08: 500 mg via INTRAVENOUS
  Filled 2017-07-08: qty 550

## 2017-07-08 MED ORDER — ACETAMINOPHEN 500 MG PO TABS
1000.0000 mg | ORAL_TABLET | Freq: Three times a day (TID) | ORAL | Status: DC
Start: 2017-07-08 — End: 2017-07-09
  Administered 2017-07-08 – 2017-07-09 (×3): 1000 mg via ORAL
  Filled 2017-07-08 (×3): qty 2

## 2017-07-08 MED ORDER — HYDROMORPHONE HCL 1 MG/ML IJ SOLN
0.5000 mg | INTRAMUSCULAR | Status: DC | PRN
  Administered 2017-07-08 (×2): 1 mg via INTRAVENOUS
  Filled 2017-07-08 (×2): qty 1

## 2017-07-08 MED ORDER — POLYETHYLENE GLYCOL 3350 17 G PO PACK: 17.0000 g | PACK | Freq: Two times a day (BID) | ORAL | Status: DC

## 2017-07-08 MED ORDER — FERROUS SULFATE 325 (65 FE) MG PO TABS: 325.0000 mg | ORAL_TABLET | Freq: Three times a day (TID) | ORAL | 3 refills | Status: DC

## 2017-07-08 MED ORDER — HYDROMORPHONE HCL 1 MG/ML IJ SOLN
0.2500 mg | INTRAMUSCULAR | Status: DC | PRN
  Administered 2017-07-08: 0.5 mg via INTRAVENOUS
  Administered 2017-07-08: 0.25 mg via INTRAVENOUS
  Administered 2017-07-08 (×2): 0.5 mg via INTRAVENOUS
  Administered 2017-07-08: 0.25 mg via INTRAVENOUS

## 2017-07-08 MED ORDER — PHENYLEPHRINE HCL 10 MG/ML IJ SOLN
INTRAMUSCULAR | Status: DC | PRN
  Administered 2017-07-08 (×2): 80 ug via INTRAVENOUS

## 2017-07-08 MED ORDER — PROPOFOL 10 MG/ML IV BOLUS
INTRAVENOUS | Status: AC
  Filled 2017-07-08: qty 40

## 2017-07-08 MED ORDER — TRANEXAMIC ACID 1000 MG/10ML IV SOLN
1000.0000 mg | Freq: Once | INTRAVENOUS | Status: AC
  Administered 2017-07-08: 1000 mg via INTRAVENOUS
  Filled 2017-07-08: qty 1100

## 2017-07-08 MED ORDER — FERROUS SULFATE 325 (65 FE) MG PO TABS
325.0000 mg | ORAL_TABLET | Freq: Three times a day (TID) | ORAL | Status: DC
  Administered 2017-07-09: 08:00:00 325 mg via ORAL
  Filled 2017-07-08: qty 1

## 2017-07-08 MED ORDER — CHLORHEXIDINE GLUCONATE 4 % EX LIQD: 60.0000 mL | Freq: Once | CUTANEOUS | Status: DC

## 2017-07-08 MED ORDER — PROPOFOL 10 MG/ML IV BOLUS
INTRAVENOUS | Status: DC | PRN
  Administered 2017-07-08 (×3): 10 mg via INTRAVENOUS

## 2017-07-08 MED ORDER — PHENOL 1.4 % MT LIQD
1.0000 | OROMUCOSAL | Status: DC | PRN
  Filled 2017-07-08: qty 177

## 2017-07-08 MED ORDER — CELECOXIB 200 MG PO CAPS
200.0000 mg | ORAL_CAPSULE | Freq: Two times a day (BID) | ORAL | Status: DC
  Administered 2017-07-08 – 2017-07-09 (×2): 200 mg via ORAL
  Filled 2017-07-08 (×2): qty 1

## 2017-07-08 MED ORDER — FENTANYL CITRATE (PF) 100 MCG/2ML IJ SOLN
INTRAMUSCULAR | Status: DC | PRN
  Administered 2017-07-08 (×2): 50 ug via INTRAVENOUS

## 2017-07-08 MED ORDER — PHENYLEPHRINE 40 MCG/ML (10ML) SYRINGE FOR IV PUSH (FOR BLOOD PRESSURE SUPPORT)
PREFILLED_SYRINGE | INTRAVENOUS | Status: AC
  Filled 2017-07-08: qty 10

## 2017-07-08 MED ORDER — ASPIRIN 81 MG PO CHEW
81.0000 mg | CHEWABLE_TABLET | Freq: Two times a day (BID) | ORAL | Status: DC
  Administered 2017-07-08 – 2017-07-09 (×2): 81 mg via ORAL
  Filled 2017-07-08 (×2): qty 1

## 2017-07-08 MED ORDER — METOCLOPRAMIDE HCL 5 MG/ML IJ SOLN: 5.0000 mg | Freq: Three times a day (TID) | INTRAMUSCULAR | Status: DC | PRN

## 2017-07-08 MED ORDER — ONDANSETRON HCL 4 MG/2ML IJ SOLN
INTRAMUSCULAR | Status: DC | PRN
  Administered 2017-07-08: 4 mg via INTRAVENOUS

## 2017-07-08 MED ORDER — MIDAZOLAM HCL 2 MG/2ML IJ SOLN
INTRAMUSCULAR | Status: AC
  Filled 2017-07-08: qty 2

## 2017-07-08 MED ORDER — HYDROMORPHONE HCL 1 MG/ML IJ SOLN
INTRAMUSCULAR | Status: AC
  Administered 2017-07-08: 0.5 mg via INTRAVENOUS
  Filled 2017-07-08: qty 1

## 2017-07-08 MED ORDER — POLYETHYLENE GLYCOL 3350 17 G PO PACK: 17.0000 g | PACK | Freq: Two times a day (BID) | ORAL | 0 refills | Status: DC

## 2017-07-08 MED ORDER — ONDANSETRON HCL 4 MG/2ML IJ SOLN
4.0000 mg | Freq: Three times a day (TID) | INTRAMUSCULAR | Status: DC | PRN
  Administered 2017-07-08: 18:00:00 4 mg via INTRAVENOUS
  Filled 2017-07-08: qty 2

## 2017-07-08 SURGICAL SUPPLY — 35 items
BAG DECANTER FOR FLEXI CONT (MISCELLANEOUS) IMPLANT
BAG ZIPLOCK 12X15 (MISCELLANEOUS) IMPLANT
BLADE SAG 18X100X1.27 (BLADE) ×3 IMPLANT
CAPT HIP TOTAL 2 ×3 IMPLANT
CLOTH BEACON ORANGE TIMEOUT ST (SAFETY) ×3 IMPLANT
COVER PERINEAL POST (MISCELLANEOUS) ×3 IMPLANT
COVER SURGICAL LIGHT HANDLE (MISCELLANEOUS) ×3 IMPLANT
DERMABOND ADVANCED (GAUZE/BANDAGES/DRESSINGS) ×2
DERMABOND ADVANCED .7 DNX12 (GAUZE/BANDAGES/DRESSINGS) ×1 IMPLANT
DRAPE STERI IOBAN 125X83 (DRAPES) ×3 IMPLANT
DRAPE U-SHAPE 47X51 STRL (DRAPES) ×6 IMPLANT
DRESSING AQUACEL AG SP 3.5X10 (GAUZE/BANDAGES/DRESSINGS) ×1 IMPLANT
DRSG AQUACEL AG SP 3.5X10 (GAUZE/BANDAGES/DRESSINGS) ×3
DURAPREP 26ML APPLICATOR (WOUND CARE) ×3 IMPLANT
ELECT REM PT RETURN 15FT ADLT (MISCELLANEOUS) ×3 IMPLANT
GLOVE BIOGEL M STRL SZ7.5 (GLOVE) IMPLANT
GLOVE BIOGEL PI IND STRL 7.5 (GLOVE) ×1 IMPLANT
GLOVE BIOGEL PI IND STRL 8.5 (GLOVE) ×1 IMPLANT
GLOVE BIOGEL PI INDICATOR 7.5 (GLOVE) ×2
GLOVE BIOGEL PI INDICATOR 8.5 (GLOVE) ×2
GLOVE ECLIPSE 8.0 STRL XLNG CF (GLOVE) ×6 IMPLANT
GLOVE ORTHO TXT STRL SZ7.5 (GLOVE) ×3 IMPLANT
GOWN STRL REUS W/TWL LRG LVL3 (GOWN DISPOSABLE) ×3 IMPLANT
GOWN STRL REUS W/TWL XL LVL3 (GOWN DISPOSABLE) ×3 IMPLANT
HOLDER FOLEY CATH W/STRAP (MISCELLANEOUS) ×3 IMPLANT
PACK ANTERIOR HIP CUSTOM (KITS) ×3 IMPLANT
SUT MNCRL AB 4-0 PS2 18 (SUTURE) ×3 IMPLANT
SUT STRATAFIX 0 PDS 27 VIOLET (SUTURE)
SUT VIC AB 1 CT1 36 (SUTURE) ×9 IMPLANT
SUT VIC AB 2-0 CT1 27 (SUTURE) ×4
SUT VIC AB 2-0 CT1 TAPERPNT 27 (SUTURE) ×2 IMPLANT
SUTURE STRATFX 0 PDS 27 VIOLET (SUTURE) IMPLANT
TRAY FOLEY W/METER SILVER 16FR (SET/KITS/TRAYS/PACK) IMPLANT
WATER STERILE IRR 1000ML POUR (IV SOLUTION) ×3 IMPLANT
YANKAUER SUCT BULB TIP 10FT TU (MISCELLANEOUS) IMPLANT

## 2017-07-08 NOTE — Evaluation (Signed)
Physical Therapy Evaluation Patient Details Name: Kenneth Yoder MRN: [id number] DOB: November 24, 1970 Today's Date: 07/08/2017   History of Present Illness  47 y.o. male admitted with R DA-THA; h/o trauma to R hip while in [employer]  Clinical Impression  Pt is s/p THA resulting in the deficits listed below (see PT Problem List). Pt ambulated 8370' with RW, no loss of balance. Min A for THA exercises.  Pt will benefit from skilled PT to increase their independence and safety with mobility to allow discharge to the venue listed below.      Follow Up Recommendations Follow surgeon's recommendation for DC plan and follow-up therapies    Equipment Recommendations  None recommended by PT    Recommendations for Other Services       Precautions / Restrictions Precautions Precautions: Fall Restrictions Weight Bearing Restrictions: No Other Position/Activity Restrictions: WBAT RLE      Mobility  Bed Mobility Overal bed mobility: Modified Independent             General bed mobility comments: with bedrail  Transfers Overall transfer level: Needs assistance Equipment used: Rolling walker (2 wheeled) Transfers: Sit to/from Stand Sit to Stand: Min guard         General transfer comment: VCs hand placement  Ambulation/Gait Ambulation/Gait assistance: Min guard Ambulation Distance (Feet): 70 Feet Assistive device: Rolling walker (2 wheeled) Gait Pattern/deviations: Step-through pattern;Decreased step length - left;Decreased step length - right   Gait velocity interpretation: Below normal speed for age/gender General Gait Details: steady with RW, no LOB, VCs sequencing  Stairs            Wheelchair Mobility    Modified Rankin (Stroke Patients Only)       Balance Overall balance assessment: Modified Independent                                           Pertinent Vitals/Pain Pain Assessment: 0-10 Pain Score: 7  Pain Location: R hip Pain  Descriptors / Indicators: Sore Pain Intervention(s): Limited activity within patient's tolerance;Monitored during session;RN gave pain meds during session;Ice applied    Home Living Family/patient expects to be discharged to:: Private residence Living Arrangements: Parent Available Help at Discharge: Family;Available 24 hours/day Type of Home: House Home Access: Stairs to enter   [employer] Corporation[employer]trance Stairs-Number of Steps: 3 Home Layout: Multi-level;Able to live on main level with bedroom/bathroom Home Equipment: Walker - 4 wheels;Walker - 2 wheels;Cane - single point      Prior Function Level of Independence: Independent with assistive device(s)         Comments: walked with SPC     Hand Dominance        Extremity/Trunk Assessment   Upper Extremity Assessment Upper Extremity Assessment: Overall WFL for tasks assessed    Lower Extremity Assessment Lower Extremity Assessment: RLE deficits/detail RLE Deficits / Details: knee ext AROM -25*, sensation intact to light touch, hip AAROM WFL    Cervical / Trunk Assessment Cervical / Trunk Assessment: Normal  Communication   Communication: No difficulties  Cognition Arousal/Alertness: Awake/alert Behavior During Therapy: WFL for tasks assessed/performed Overall Cognitive Status: Within Functional Limits for tasks assessed                                        General Comments  Exercises Total Joint Exercises Ankle Circles/Pumps: AROM;Both;10 reps;Seated Long Arc Quad: AROM;5 reps;Right;Seated  Heel Slides AAROM x 10 R supine Hip Abduction AAROM x 10 R supine   Assessment/Plan    PT Assessment Patient needs continued PT services  PT Problem List Decreased strength;Decreased balance;Decreased activity tolerance;Decreased mobility;Pain       PT Treatment Interventions DME instruction;Gait training;Stair training;Therapeutic exercise;Therapeutic activities;Functional mobility training;Patient/family  education    PT Goals (Current goals can be found in the Care Plan section)  Acute Rehab PT Goals Patient Stated Goal: walking, swimming, canoeing PT Goal Formulation: With patient Time For Goal Achievement: 07/15/17 Potential to Achieve Goals: Good    Frequency 7X/week   Barriers to discharge        Co-evaluation               AM-PAC PT "6 Clicks" Daily Activity  Outcome Measure Difficulty turning over in bed (including adjusting bedclothes, sheets and blankets)?: A Little Difficulty moving from lying on back to sitting on the side of the bed? : A Little Difficulty sitting down on and standing up from a chair with arms (e.g., wheelchair, bedside commode, etc,.)?: A Little Help needed moving to and from a bed to chair (including a wheelchair)?: A Little Help needed walking in hospital room?: A Little Help needed climbing 3-5 steps with a railing? : Total 6 Click Score: 16    End of Session Equipment Utilized During Treatment: Gait belt Activity Tolerance: Patient tolerated treatment well Patient left: in chair;with call bell/phone within reach Nurse Communication: Mobility status PT Visit Diagnosis: Muscle weakness (generalized) (M62.81);Difficulty in walking, not elsewhere classified (R26.2);Pain Pain - Right/Left: Right Pain - part of body: Hip    Time: 1610-9604 PT Time Calculation (min) (ACUTE ONLY): 29 min   Charges:   PT Evaluation $PT Eval Low Complexity: 1 Low PT Treatments $Gait Training: 8-22 mins   PT G Codes:          Tamala Ser 07/08/2017, 2:54 PM [phone]

## 2017-07-08 NOTE — Anesthesia Preprocedure Evaluation (Signed)
Anesthesia Evaluation  Patient identified by MRN, date of birth, ID band Patient awake    Reviewed: Allergy & Precautions, NPO status , Patient's Chart, lab work & pertinent test results  Airway Mallampati: II  TM Distance: >3 FB Neck ROM: Full    Dental no notable dental hx.    Pulmonary neg pulmonary ROS, former smoker,    Pulmonary exam normal breath sounds clear to auscultation       Cardiovascular negative cardio ROS Normal cardiovascular exam Rhythm:Regular Rate:Normal     Neuro/Psych negative neurological ROS  negative psych ROS   GI/Hepatic negative GI ROS, Neg liver ROS,   Endo/Other  negative endocrine ROS  Renal/GU negative Renal ROS  negative genitourinary   Musculoskeletal negative musculoskeletal ROS (+)   Abdominal   Peds negative pediatric ROS (+)  Hematology negative hematology ROS (+)   Anesthesia Other Findings   Reproductive/Obstetrics negative OB ROS                             Anesthesia Physical Anesthesia Plan  ASA: I  Anesthesia Plan: Spinal   Post-op Pain Management:    Induction: Intravenous  PONV Risk Score and Plan: 1 and Ondansetron and Dexamethasone  Airway Management Planned: Simple Face Mask  Additional Equipment:   Intra-op Plan:   Post-operative Plan:   Informed Consent: I have reviewed the patients History and Physical, chart, labs and discussed the procedure including the risks, benefits and alternatives for the proposed anesthesia with the patient or authorized representative who has indicated his/her understanding and acceptance.   Dental advisory given  Plan Discussed with: CRNA and Surgeon  Anesthesia Plan Comments:         Anesthesia Quick Evaluation

## 2017-07-08 NOTE — Transfer of Care (Signed)
Immediate Anesthesia Transfer of Care Note  Patient: Kenneth Yoder  Procedure(s) Performed: RIGHT TOTAL HIP ARTHROPLASTY ANTERIOR APPROACH (Right Hip)  Patient Location: PACU  Anesthesia Type:Spinal  Level of Consciousness: sedated  Airway & Oxygen Therapy: Patient Spontanous Breathing and Patient connected to face mask oxygen  Post-op Assessment: Report given to RN and Post -op Vital signs reviewed and stable  Post vital signs: Reviewed and stable  Last Vitals:  Vitals:   07/08/17 0556  BP: 134/86  Pulse: (!) 107  Resp: 20  Temp: 36.8 C  SpO2: 97%    Last Pain:  Vitals:   07/08/17 0556  TempSrc: Oral  PainSc:       Patients Stated Pain Goal: 4 (07/08/17 0546)  Complications: No apparent anesthesia complications

## 2017-07-08 NOTE — Anesthesia Postprocedure Evaluation (Signed)
Anesthesia Post Note  Patient: Kenneth Yoder  Procedure(s) Performed: RIGHT TOTAL HIP ARTHROPLASTY ANTERIOR APPROACH (Right Hip)     Patient location during evaluation: PACU Anesthesia Type: Spinal Level of consciousness: oriented and awake and alert Pain management: pain level controlled Vital Signs Assessment: post-procedure vital signs reviewed and stable Respiratory status: spontaneous breathing, respiratory function stable and patient connected to nasal cannula oxygen Cardiovascular status: blood pressure returned to baseline and stable Postop Assessment: no headache, no backache and no apparent nausea or vomiting Anesthetic complications: no    Last Vitals:  Vitals:   07/08/17 1212 07/08/17 1315  BP: 134/79 139/90  Pulse: 73 82  Resp: 16 16  Temp: 36.8 C 37.1 C  SpO2: 100% 100%    Last Pain:  Vitals:   07/08/17 1519  TempSrc:   PainSc: 4                  Alysa Duca S

## 2017-07-08 NOTE — Interval H&P Note (Signed)
History and Physical Interval Note:  07/08/2017 6:47 AM  Kenneth SchanzAnthony Arth  has presented today for surgery, with the diagnosis of Right hip pain, labral tear  The various methods of treatment have been discussed with the patient and family. After consideration of risks, benefits and other options for treatment, the patient has consented to  Procedure(s): RIGHT TOTAL HIP ARTHROPLASTY ANTERIOR APPROACH (Right) as a surgical intervention .  The patient's history has been reviewed, patient examined, no change in status, stable for surgery.  I have reviewed the patient's chart and labs.  Questions were answered to the patient's satisfaction.     Shelda PalMatthew D Sonjia Wilcoxson

## 2017-07-08 NOTE — Op Note (Signed)
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      SURGEON:  Madlyn Frankel. Charlann Boxer, M.D.      ASSISTANT:  Lanney Gins, PA-C     ANESTHESIA:  Spinal.      SPECIMENS:  None.      COMPLICATIONS:  None.      BLOOD LOSS:  300 cc     DRAINS:  None      INDICATION OF THE PROCEDURE:  Kenneth Yoder is a 47 y.o. male who had   presented to office for evaluation of right hip pain.  Radiographs revealed   progressive degenerative changes with MRI findings of labral tearing.  The patient had painful limited range of   motion significantly affecting their overall quality of life.  The patient was failing to    respond to conservative measures, and at this point was ready   to proceed with more definitive measures.  Pos and cons of arthroscopic surgery versus total hip arthroplasty were discussed.  After an extensive review he opted for arthroplasty for definitive treatment. The patient has noted progressive   degenerative changes in his hip, progressive problems and dysfunction   with regarding the hip prior to surgery.  Consent was obtained for   benefit of pain relief.  Specific risk of infection, DVT, component   failure,  dislocation, need for revision surgery, as well discussion of   the anterior versus posterior approach were reviewed.  Consent was   obtained for benefit of anterior pain relief through an anterior   approach.      PROCEDURE IN DETAIL:  The patient was brought to operative theater.   Once adequate anesthesia, preoperative antibiotics, 2gm of Ancef, 10 mg of Decadron, and 1 gm of Tranexamic Acid administered.   The patient was positioned supine on the OSI Hanna table.  Once adequate   padding of boney process was carried out, we had predraped out the hip, and  used fluoroscopy to confirm orientation of the pelvis and position.      The right hip was then prepped and draped from proximal iliac crest to   mid thigh with shower curtain technique.      Time-out was performed identifying the patient, planned procedure, and   extremity.     An incision was then made 2 cm distal and lateral to the  anterior superior iliac spine extending over the orientation of the   tensor fascia lata muscle and sharp dissection was carried down to the   fascia of the muscle and protractor placed in the soft tissues.      The fascia was then incised.  The muscle belly was identified and swept   laterally and retractor placed along the superior neck.  Following   cauterization of the circumflex vessels and removing some pericapsular   fat, a second cobra retractor was placed on the inferior neck.  A third   retractor was placed on the anterior acetabulum after elevating the   anterior rectus.  A L-capsulotomy was along the line of the   superior neck to the trochanteric fossa, then extended proximally and   distally.  Tag sutures were placed and the retractors were then placed   intracapsular.  We then identified the trochanteric fossa and   orientation of my neck cut, confirmed this radiographically   and then made a neck osteotomy with the femur on traction.  The femoral   head was removed without  difficulty or complication.  Traction was let   off and retractors were placed posterior and anterior around the   acetabulum.      The labrum and foveal tissue were debrided.  I began reaming with a 46mm   reamer and reamed up to 53mm reamer with good bony bed preparation and a 54mm   cup was chosen.  The final 54mm Pinnacle cup was then impacted under fluoroscopy  to confirm the depth of penetration and orientation with respect to   abduction.  A screw was placed followed by the hole eliminator.  The final   36+4 neutral Altrex liner was impacted with good visualized rim fit.  The cup was positioned anatomically within the acetabular portion of the pelvis.      At this point, the femur was rolled at 80 degrees.  Further capsule was   released off the inferior aspect of the femoral neck.  I then   released the superior capsule proximally.  The hook was placed laterally   along the femur and elevated manually and held in position with the bed   hook.  The leg was then extended and adducted with the leg rolled to 100   degrees of external rotation.  Once the proximal femur was fully   exposed, I used a box osteotome to set orientation.  I then began   broaching with the starting chili pepper broach and passed this by hand and then broached up to 5.  With the 5 broach in place I chose a high offset neck and did several trial reductions.  The offset was appropriate, leg lengths   appeared to be equal best matched with the +5 head ball confirmed radiographically.   Given these findings, I went ahead and dislocated the hip, repositioned all   retractors and positioned the right hip in the extended and abducted position.  The final 5 Hi Tri Lock stem was   chosen and it was impacted down to the level of neck cut.  Based on this   and the trial reduction, a 36+5 delta ceramic ball was chosen and   impacted onto a clean and dry trunnion, and the hip was reduced.  The   hip had been irrigated  throughout the case again at this point.  I did   reapproximate the superior capsular leaflet to the anterior leaflet   using #1  Vicryl.  The fascia of the   tensor fascia lata muscle was then reapproximated using #1 Vicryl and #0 Stratafix sutures.  The   remaining wound was closed with 2-0 Vicryl and running 4-0 Monocryl.   The hip was cleaned, dried, and dressed sterilely using Dermabond and   Aquacel dressing.  He was then brought   to recovery room in stable condition tolerating the procedure well.    Lanney GinsMatthew Babish, PA-C was present for the entirety of the case involved from   preoperative positioning, perioperative retractor management, general   facilitation of the case, as well as primary wound closure as assistant.            Madlyn FrankelMatthew D. Charlann Boxerlin, M.D.        07/08/2017 8:45 AM

## 2017-07-08 NOTE — Discharge Instructions (Signed)

## 2017-07-08 NOTE — Anesthesia Procedure Notes (Signed)
Spinal  Patient location during procedure: OR Start time: 07/08/2017 7:11 AM End time: 07/08/2017 7:16 AM Staffing Anesthesiologist: Myrtie Soman, MD Performed: anesthesiologist  Preanesthetic Checklist Completed: patient identified, site marked, surgical consent, pre-op evaluation, timeout performed, IV checked, risks and benefits discussed and monitors and equipment checked Spinal Block Patient position: sitting Prep: Betadine Patient monitoring: heart rate, continuous pulse ox and blood pressure Injection technique: single-shot Needle Needle type: Sprotte  Needle gauge: 24 G Needle length: 9 cm Additional Notes Expiration date of kit checked and confirmed. Patient tolerated procedure well, without complications.

## 2017-07-09 DIAGNOSIS — E669 Obesity, unspecified: Secondary | ICD-10-CM | POA: Diagnosis present

## 2017-07-09 LAB — CBC
HEMATOCRIT: 41.8 % (ref 39.0–52.0)
HEMOGLOBIN: 14.5 g/dL (ref 13.0–17.0)
MCH: 32.4 pg (ref 26.0–34.0)
MCHC: 34.7 g/dL (ref 30.0–36.0)
MCV: 93.5 fL (ref 78.0–100.0)
Platelets: 279 10*3/uL (ref 150–400)
RBC: 4.47 MIL/uL (ref 4.22–5.81)
RDW: 12.1 % (ref 11.5–15.5)
WBC: 14 10*3/uL — ABNORMAL HIGH (ref 4.0–10.5)

## 2017-07-09 LAB — BASIC METABOLIC PANEL
Anion gap: 7 (ref 5–15)
BUN: 11 mg/dL (ref 6–20)
CO2: 24 mmol/L (ref 22–32)
CREATININE: 0.79 mg/dL (ref 0.61–1.24)
Calcium: 8.7 mg/dL — ABNORMAL LOW (ref 8.9–10.3)
Chloride: 107 mmol/L (ref 101–111)
GFR calc Af Amer: >60 mL/min (ref >60)
GFR calc non Af Amer: >60 mL/min (ref >60)
GLUCOSE: 120 mg/dL — AB (ref 65–99)
POTASSIUM: 4.1 mmol/L (ref 3.5–5.1)
Sodium: 138 mmol/L (ref 135–145)

## 2017-07-09 NOTE — Discharge Summary (Signed)
Physician Discharge Summary  Patient ID: Kenneth Yoder MRN: [id number] DOB/AGE: Sep 20, 1970 47 y.o.  Admit date: 07/08/2017 Discharge date:  07/09/2017   Procedures:  Procedure(s) (LRB): RIGHT TOTAL HIP ARTHROPLASTY ANTERIOR APPROACH (Right)  Attending Physician:  Dr. Durene RomansMatthew Olin   Admission Diagnoses:    Right hip primary OA / pain  Discharge Diagnoses:  Principal Problem:   S/P right THA, AA Active Problems:   Obese  Past Medical History:  Diagnosis Date  . Chronic hip pain   . Depression     HPI:    Kenneth Yoder, 47 y.o. male, has a history of pain and functional disability in the right hip(s) due to arthritis and patient has failed non-surgical conservative treatments for greater than 12 weeks to include NSAID's and/or analgesics, corticosteriod injections, use of assistive devices and activity modification.  Onset of symptoms was gradual starting >10 years ago with gradually worsening course since that time.The patient noted no past surgery on the right hip(s).  Patient currently rates pain in the right hip at 10 out of 10 with activity. Patient has night pain, worsening of pain with activity and weight bearing, trendelenberg gait, pain that interfers with activities of daily living and pain with passive range of motion. Patient has evidence of periarticular osteophytes and joint space narrowing by imaging studies. This condition presents safety issues increasing the risk of falls. There is no current active infection.  Risks, benefits and expectations were discussed with the patient.  Risks including but not limited to the risk of anesthesia, blood clots, nerve damage, blood vessel damage, failure of the prosthesis, infection and up to and including death.  Patient understand the risks, benefits and expectations and wishes to proceed with surgery.    PCP: Patient, No Pcp Per   Discharged Condition: good  Hospital Course:  Patient underwent the above stated procedure  on 07/08/2017. Patient tolerated the procedure well and brought to the recovery room in good condition and subsequently to the floor.  POD #1 BP: 142/99 ; Pulse: 71 ; Temp: 99 F (37.2 C) ; Resp: 16 Patient reports pain as mild, pain controlled.  No events throughout the night. Looking forward to progressing and improving.  Ready to be discharged home.  Dorsiflexion/plantar flexion intact, incision: dressing C/D/I, no cellulitis present and compartment soft.   LABS  Basename    HGB     14.5  HCT     41.8    Discharge Exam: General appearance: alert, cooperative and no distress Extremities: Homans sign is negative, no sign of DVT, no edema, redness or tenderness in the calves or thighs and no ulcers, gangrene or trophic changes  Disposition: Home with follow up in 2 weeks   Follow-up Information    Durene Romanslin, Siddhi Dornbush, MD. Schedule an appointment as soon as possible for a visit in 2 week(s).   Specialty:  Orthopedic Surgery Contact information: [address] [place] [address] [phone]           Discharge Instructions    Call MD / Call 911   Complete by:  As directed    If you experience chest pain or shortness of breath, CALL 911 and be transported to the hospital emergency room.  If you develope a fever above 101 F, pus (white drainage) or increased drainage or redness at the wound, or calf pain, call your surgeon's office.   Change dressing   Complete by:  As directed    Maintain surgical dressing until follow up  in the clinic. If the edges start to pull up, may reinforce with tape. If the dressing is no longer working, may remove and cover with gauze and tape, but must keep the area dry and clean.  Call with any questions or concerns.   Constipation Prevention   Complete by:  As directed    Drink plenty of fluids.  Prune juice may be helpful.  You may use a stool softener, such as Colace (over the counter) 100 mg twice a day.  Use MiraLax (over the  counter) for constipation as needed.   Diet - low sodium heart healthy   Complete by:  As directed    Discharge instructions   Complete by:  As directed    Maintain surgical dressing until follow up in the clinic. If the edges start to pull up, may reinforce with tape. If the dressing is no longer working, may remove and cover with gauze and tape, but must keep the area dry and clean.  Follow up in 2 weeks at [facility]. Call with any questions or concerns.   Increase activity slowly as tolerated   Complete by:  As directed    Weight bearing as tolerated with assist device (walker, cane, etc) as directed, use it as long as suggested by your surgeon or therapist, typically at least 4-6 weeks.   TED hose   Complete by:  As directed    Use stockings (TED hose) for 2 weeks on both leg(s).  You may remove them at night for sleeping.      Allergies as of 07/09/2017      Reactions   Etodolac    Bloody stools Sundulac and etodolac cause bloody stools       Medication List    STOP taking these medications   aspirin-acetaminophen-caffeine 250-250-65 MG tablet Commonly known as:  EXCEDRIN MIGRAINE     TAKE these medications   acetaminophen 500 MG tablet Commonly known as:  TYLENOL Take 2 tablets (1,000 mg total) by mouth every 8 (eight) hours.   aspirin 81 MG chewable tablet Commonly known as:  ASPIRIN CHILDRENS Chew 1 tablet (81 mg total) by mouth 2 (two) times daily. Take for 4 weeks, then resume regular dose.   docusate sodium 100 MG capsule Commonly known as:  COLACE Take 1 capsule (100 mg total) by mouth 2 (two) times daily.   ferrous sulfate 325 (65 FE) MG tablet Commonly known as:  FERROUSUL Take 1 tablet (325 mg total) by mouth 3 (three) times daily with meals.   methocarbamol 500 MG tablet Commonly known as:  ROBAXIN Take 1 tablet (500 mg total) by mouth every 6 (six) hours as needed for muscle spasms.   MINERAL ICE EX Apply 1 application topically daily  as needed (pain).   oxyCODONE 5 MG immediate release tablet Commonly known as:  Oxy IR/ROXICODONE Take 1-2 tablets (5-10 mg total) by mouth every 4 (four) hours as needed for moderate pain or severe pain.   polyethylene glycol packet Commonly known as:  MIRALAX / GLYCOLAX Take 17 g by mouth 2 (two) times daily.            Discharge Care Instructions  (From admission, onward)        Start     Ordered   07/09/17 0000  Change dressing    Comments:  Maintain surgical dressing until follow up in the clinic. If the edges start to pull up, may reinforce with tape. If the dressing is no longer  working, may remove and cover with gauze and tape, but must keep the area dry and clean.  Call with any questions or concerns.   07/09/17 1610       Signed: Anastasio Auerbach. Toree Edling   PA-C  07/09/2017, 8:55 AM

## 2017-07-09 NOTE — Progress Notes (Signed)
Physical Therapy Treatment Patient Details Name: Kenneth Yoder MRN: [id number] DOB: [birth year] Today's Date: 07/09/2017    History of Present Illness 47 y.o. male admitted with R DA-THA; h/o trauma to R hip while in [place]    PT Comments    Pt has met PT goals and is ready to DC home from PT standpoint. He demonstrates understanding of HEP and ambulated 190' with RW, no loss of balance.   Follow Up Recommendations  Follow surgeon's recommendation for DC plan and follow-up therapies     Equipment Recommendations  None recommended by PT    Recommendations for Other Services       Precautions / Restrictions Precautions Precautions: Fall Restrictions Weight Bearing Restrictions: No Other Position/Activity Restrictions: WBAT RLE    Mobility  Bed Mobility Overal bed mobility: Modified Independent             General bed mobility comments: with bedrail  Transfers Overall transfer level: Needs assistance Equipment used: Rolling walker (2 wheeled) Transfers: Sit to/from Stand Sit to Stand: Supervision         General transfer comment: VCs hand placement  Ambulation/Gait Ambulation/Gait assistance: Min guard Ambulation Distance (Feet): 190 Feet Assistive device: Rolling walker (2 wheeled) Gait Pattern/deviations: Step-through pattern;Decreased step length - left;Decreased step length - right   Gait velocity interpretation: Below normal speed for age/gender General Gait Details: steady with RW, no LOB     Stairs Stairs: Yes   Stair Management: No rails;Backwards;Step to pattern;With walker Number of Stairs: 2 General stair comments: VCs sequencing  Wheelchair Mobility    Modified Rankin (Stroke Patients Only)       Balance Overall balance assessment: Modified Independent                                          Cognition Arousal/Alertness: Awake/alert Behavior During Therapy: WFL for tasks assessed/performed Overall  Cognitive Status: Within Functional Limits for tasks assessed                                        Exercises      General Comments        Pertinent Vitals/Pain Pain Score: 3  Pain Descriptors / Indicators: Sore Pain Intervention(s): Limited activity within patient's tolerance;Monitored during session;Premedicated before session;Ice applied    Home Living                      Prior Function            PT Goals (current goals can now be found in the care plan section) Acute Rehab PT Goals Patient Stated Goal: walking, swimming, canoeing PT Goal Formulation: With patient Time For Goal Achievement: 07/15/17 Potential to Achieve Goals: Good Progress towards PT goals: Goals met/education completed, patient discharged from PT    Frequency    7X/week      PT Plan Current plan remains appropriate    Co-evaluation              AM-PAC PT "6 Clicks" Daily Activity  Outcome Measure  Difficulty turning over in bed (including adjusting bedclothes, sheets and blankets)?: None Difficulty moving from lying on back to sitting on the side of the bed? : A Little Difficulty sitting down on and standing up from a chair with  arms (e.g., wheelchair, bedside commode, etc,.)?: None Help needed moving to and from a bed to chair (including a wheelchair)?: None Help needed walking in hospital room?: None Help needed climbing 3-5 steps with a railing? : A Little 6 Click Score: 22    End of Session Equipment Utilized During Treatment: Gait belt Activity Tolerance: Patient tolerated treatment well Patient left: in chair;with call bell/phone within reach;with family/visitor present Nurse Communication: Mobility status PT Visit Diagnosis: Muscle weakness (generalized) (M62.81);Difficulty in walking, not elsewhere classified (R26.2);Pain Pain - Right/Left: Right Pain - part of body: Hip     Time: 0525-9102 PT Time Calculation (min) (ACUTE ONLY): 24  min  Charges:  $Gait Training: 8-22 mins $Therapeutic Exercise: 8-22 mins                    G Codes:         Philomena Doheny 07/09/2017, 10:18 AM [phone]

## 2017-07-09 NOTE — Progress Notes (Signed)
     Subjective: 1 Day Post-Op Procedure(s) (LRB): RIGHT TOTAL HIP ARTHROPLASTY ANTERIOR APPROACH (Right)   Seen by Dr. Charlann Boxerlin. Patient reports pain as mild, pain controlled.  No events throughout the night. Looking forward to progressing and improving.  Ready to be discharged home.   Objective:   VITALS:   Vitals:   07/09/17 0138 07/09/17 0635  BP: (!) 143/80 (!) 142/99  Pulse: 68 71  Resp: 16 16  Temp: 98.5 F (36.9 C) 99 F (37.2 C)  SpO2: 99% 96%    Dorsiflexion/Plantar flexion intact Incision: dressing C/Yoder/I No cellulitis present Compartment soft  LABS Recent Labs    07/09/17 0538  HGB 14.5  HCT 41.8  WBC 14.0*  PLT 279    Recent Labs    07/09/17 0538  NA 138  K 4.1  BUN 11  CREATININE 0.79  GLUCOSE 120*     Assessment/Plan: 1 Day Post-Op Procedure(s) (LRB): RIGHT TOTAL HIP ARTHROPLASTY ANTERIOR APPROACH (Right) Foley cath Yoder/c'ed Advance diet Up with therapy Yoder/C IV fluids Discharge home [facility]. Follow up with OLIN,Kenneth Yoder in 2 weeks.  Contact information:  [facility] [address] [place] [id number] [phone]    Obese (BMI 30-39.9) Estimated body mass index is 31.8 kg/m as calculated from the following:   Height as of this encounter: 6\' 1"  (1.854 m).   Weight as of this encounter: 109.3 kg (241 lb). Patient also counseled that weight may inhibit the healing process Patient counseled that losing weight will help with future health issues      Kenneth AuerbachMatthew S. Kadeshia Yoder   PAC  07/09/2017, 8:42 AM

## 2019-03-14 DIAGNOSIS — M722 Plantar fascial fibromatosis: Secondary | ICD-10-CM

## 2019-03-14 HISTORY — DX: Plantar fascial fibromatosis: M72.2

## 2019-05-12 NOTE — H&P (Signed)
TOTAL HIP ADMISSION H&P  Patient is admitted for left total hip arthroplasty.  Subjective:  Chief Complaint:    Left hip primary OA / pain  HPI: Kenneth Yoder, 48 y.o. male, has a history of pain and functional disability in the left hip(s) due to trauma and arthritis and patient has failed non-surgical conservative treatments for greater than 12 weeks to include NSAID's and/or analgesics, corticosteriod injections and activity modification.  Onset of symptoms was gradual starting 14 years ago with gradually worsening course since that time.The patient noted prior procedures of the hip to include arthroplasty on the right hip(s).  Patient currently rates pain in the left hip at 9 out of 10 with activity. Patient has night pain, worsening of pain with activity and weight bearing, trendelenberg gait, pain that interfers with activities of daily living and pain with passive range of motion. Patient has evidence of periarticular osteophytes and joint space narrowing by imaging studies. This condition presents safety issues increasing the risk of falls.   There is no current active infection.   Risks, benefits and expectations were discussed with the patient.  Risks including but not limited to the risk of anesthesia, blood clots, nerve damage, blood vessel damage, failure of the prosthesis, infection and up to and including death.  Patient understand the risks, benefits and expectations and wishes to proceed with surgery.   PCP: Patient, No Pcp Per  D/C Plans:       Home (same day)  Post-op Meds:       No Rx given   Tranexamic Acid:      To be given - IV   Decadron:      Is to be given  FYI:      ASA  Oxycodone  DME:   Pt already has equipment   PT: [facility]  Pharmacy: [facility] [address]    Patient Active Problem List   Diagnosis Date Noted  . Obese 07/09/2017  . 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 Chronic hip pain   . Depression     Past  Surgical History:  Procedure Laterality Date  . REFRACTIVE SURGERY  04/1998  . TOTAL HIP ARTHROPLASTY Right 07/08/2017   Procedure: RIGHT TOTAL HIP ARTHROPLASTY ANTERIOR APPROACH;  Surgeon: Durene Romans, MD;  Location: [facility];  Service: Orthopedics;  Laterality: Right;    No current facility-administered medications for this encounter.   Current Outpatient Medications  Medication Sig Dispense Refill Last Dose  . acetaminophen (TYLENOL) 500 MG tablet Take 2 tablets (1,000 mg total) by mouth every 8 (eight) hours. 30 tablet 0   . docusate sodium (COLACE) 100 MG capsule Take 1 capsule (100 mg total) by mouth 2 (two) times daily. 10 capsule 0   . ferrous sulfate (FERROUSUL) 325 (65 FE) MG tablet Take 1 tablet (325 mg total) by mouth 3 (three) times daily with meals.  3   . Menthol, Topical Analgesic, (MINERAL ICE EX) Apply 1 application topically daily as needed (pain).     . methocarbamol (ROBAXIN) 500 MG tablet Take 1 tablet (500 mg total) by mouth every 6 (six) hours as needed for muscle spasms. 40 tablet 0   . oxyCODONE (OXY IR/ROXICODONE) 5 MG immediate release tablet Take 1-2 tablets (5-10 mg total) by mouth every 4 (four) hours as needed for moderate pain or severe pain. 60 tablet 0   . polyethylene glycol (MIRALAX / GLYCOLAX) packet Take 17 g by mouth 2 (  two) times daily. 14 each 0    Allergies  Allergen Reactions  . Etodolac     Bloody stools Sundulac and etodolac cause bloody stools     Social History   Tobacco Use  . Smoking status: Former Smoker    Packs/day: 0.50    Types: Cigarettes    Quit date: 09/2016    Years since quitting: 2.6  . Smokeless tobacco: Never Used  Substance Use Topics  . Alcohol use: No       Review of Systems  Constitutional: Negative.   HENT: Negative.   Eyes: Negative.   Respiratory: Negative.   Cardiovascular: Negative.   Gastrointestinal: Positive for heartburn.  Genitourinary: Negative.   Musculoskeletal: Positive for joint pain.   Skin: Negative.   Neurological: Negative.   Endo/Heme/Allergies: Negative.      Objective:  Physical Exam  Constitutional: He is oriented to person, place, and time. He appears well-developed.  HENT:  Head: Normocephalic.  Eyes: Pupils are equal, round, and reactive to light.  Neck: No JVD present. No tracheal deviation present. No thyromegaly present.  Cardiovascular: Normal rate, regular rhythm and intact distal pulses.  Respiratory: Effort normal and breath sounds normal. No respiratory distress. He has no wheezes.  GI: Soft. There is no abdominal tenderness. There is no guarding.  Musculoskeletal:     Cervical back: Neck supple.     Left hip: Tenderness and bony tenderness present. No swelling, deformity or lacerations. Decreased range of motion. Decreased strength.  Lymphadenopathy:    He has no cervical adenopathy.  Neurological: He is alert and oriented to person, place, and time. A sensory deficit (tingling sensation right plantar facitis) is present.  Skin: Skin is warm and dry.  Psychiatric: He has a normal mood and affect.     Labs:  Estimated body mass index is 31.8 kg/m as calculated from the following:   Height as of 07/08/17: 6\' 1"  (1.854 m).   Weight as of 07/08/17: 109.3 kg.   Imaging Review Plain radiographs demonstrate severe degenerative joint disease of the left hip. The bone quality appears to be good for age and reported activity level.      Assessment/Plan:  End stage arthritis, left hip  The patient history, physical examination, clinical judgement of the provider and imaging studies are consistent with end stage degenerative joint disease of the left hip(s) and total hip arthroplasty is deemed medically necessary. The treatment options including medical management, injection therapy, arthroscopy and arthroplasty were discussed at length. The risks and benefits of total hip arthroplasty were presented and reviewed. The risks due to aseptic  loosening, infection, stiffness, dislocation/subluxation,  thromboembolic complications and other imponderables were discussed.  The patient acknowledged the explanation, agreed to proceed with the plan and consent was signed. Patient is being admitted for treatment for surgery, pain control, PT, OT, prophylactic antibiotics, VTE prophylaxis, progressive ambulation and ADL's and discharge planning.The patient is planning to be discharged home.     [place] Ilanna Deihl   PA-C  05/12/2019, 3:01 PM

## 2019-05-26 NOTE — Patient Instructions (Signed)
DUE TO COVID-19 ONLY ONE VISITOR IS ALLOWED TO COME WITH YOU AND STAY IN THE WAITING ROOM ONLY DURING PRE OP AND PROCEDURE DAY OF SURGERY. THE 1 VISITOR MAY VISIT WITH YOU AFTER SURGERY IN YOUR PRIVATE ROOM DURING VISITING HOURS ONLY!  YOU NEED TO HAVE A COVID 19 TEST ON: 05/28/2019 @ 8:35 AM, THIS TEST MUST BE DONE BEFORE SURGERY, COME  [address], [place] , [address].  [facility]) ONCE YOUR COVID TEST IS COMPLETED, PLEASE BEGIN THE QUARANTINE INSTRUCTIONS AS OUTLINED IN YOUR HANDOUT.                Kenneth Yoder      Your procedure is scheduled on: 1/19/20201     Report to [facility] Main  Entrance    Report to admitting at: 9:00 AM     Call this number if you have problems the morning of surgery [phone]    Remember:    BRUSH YOUR TEETH MORNING OF SURGERY AND RINSE YOUR MOUTH OUT, NO CHEWING GUM CANDY OR MINTS.     Take these medicines the morning of surgery with A SIP OF WATER: PANTOPRAZOLE             NO SOLID FOOD AFTER MIDNIGHT THE NIGHT PRIOR TO SURGERY. NOTHING BY MOUTH EXCEPT CLEAR LIQUIDS UNTIL: 8:30 AM. PLEASE FINISH ENSURE DRINK PER SURGEON ORDER  WHICH NEEDS TO BE COMPLETED AT: 8:30 AM .   CLEAR LIQUID DIET   Foods Allowed                                                                     Foods Excluded  Coffee and tea, regular and decaf                             liquids that you cannot  Plain Jell-O any favor except red or purple                                           see through such as: Fruit ices (not with fruit pulp)                                     milk, soups, orange juice  Iced Popsicles                                    All solid food Carbonated beverages, regular and diet                                    Cranberry, grape and apple juices Sports drinks like Gatorade Lightly seasoned clear broth or consume(fat free) Sugar, honey syrup  Sample Menu Breakfast  Lunch                                      Supper Cranberry juice                    Beef broth                            Chicken broth Jell-O                                     Grape juice                           Apple juice Coffee or tea                        Jell-O                                      Popsicle                                                Coffee or tea                        Coffee or tea  _____________________________________________________________________                                  Bonita Quin may not have any metal on your body including hair pins and              piercings  Do not wear jewelry, lotions, powders or perfumes, deodorant             Men may shave face and neck.     Do not bring valuables to the hospital. [facility] IS NOT             RESPONSIBLE   FOR VALUABLES.  Contacts, dentures or bridgework may not be worn into surgery.  Leave suitcase in the car. After surgery it may be brought to your room.     Patients discharged the day of surgery will not be allowed to drive home. IF YOU ARE HAVING SURGERY AND GOING HOME THE SAME DAY, YOU MUST HAVE AN ADULT TO DRIVE YOU HOME AND  BE WITH YOU FOR 24 HOURS. YOU MAY GO HOME BY TAXI OR UBER OR ORTHERWISE, BUT AN ADULT MUST ACCOMPANY YOU HOME AND STAY WITH YOU FOR 24 HOURS.  Name and phone number of your driver:  Special Instructions: N/A              Please read over the following fact sheets you were given: _____________________________________________________________________            [facility] - Preparing for Surgery Before surgery, you can play an important role.  Because skin is not sterile, your skin needs to be as free of germs as possible.  You can reduce the number  of germs on your skin by washing with CHG (chlorahexidine gluconate) soap before surgery.  CHG is an antiseptic cleaner which kills germs and bonds with the skin to continue killing germs even after washing. Please DO NOT use if you have  an allergy to CHG or antibacterial soaps.  If your skin becomes reddened/irritated stop using the CHG and inform your nurse when you arrive at Short Stay. Do not shave (including legs and underarms) for at least 48 hours prior to the first CHG shower.  You may shave your face/neck. Please follow these instructions carefully:  1.  Shower with CHG Soap the night before surgery and the  morning of Surgery.  2.  If you choose to wash your hair, wash your hair first as usual with your  normal  shampoo.  3.  After you shampoo, rinse your hair and body thoroughly to remove the  shampoo.                           4.  Use CHG as you would any other liquid soap.  You can apply chg directly  to the skin and wash                       Gently with a scrungie or clean washcloth.  5.  Apply the CHG Soap to your body ONLY FROM THE NECK DOWN.   Do not use on face/ open                           Wound or open sores. Avoid contact with eyes, ears mouth and genitals (private parts).                       Wash face,  Genitals (private parts) with your normal soap.             6.  Wash thoroughly, paying special attention to the area where your surgery  will be performed.  7.  Thoroughly rinse your body with warm water from the neck down.  8.  DO NOT shower/wash with your normal soap after using and rinsing off  the CHG Soap.                9.  Pat yourself dry with a clean towel.            10.  Wear clean pajamas.            11.  Place clean sheets on your bed the night of your first shower and do not  sleep with pets. Day of Surgery : Do not apply any lotions/deodorants the morning of surgery.  Please wear clean clothes to the hospital/surgery center.  FAILURE TO FOLLOW THESE INSTRUCTIONS MAY RESULT IN THE CANCELLATION OF YOUR SURGERY PATIENT SIGNATURE_________________________________  NURSE  SIGNATURE__________________________________  ________________________________________________________________________   Kenneth Yoder  An incentive spirometer is a tool that can help keep your lungs clear and active. This tool measures how well you are filling your lungs with each breath. Taking long deep breaths may help reverse or decrease the chance of developing breathing (pulmonary) problems (especially infection) following:  A long period of time when you are unable to move or be active. BEFORE THE PROCEDURE   If the spirometer includes an indicator to show your best effort, your nurse or respiratory therapist will set it to a desired  goal.  If possible, sit up straight or lean slightly forward. Try not to slouch.  Hold the incentive spirometer in an upright position. INSTRUCTIONS FOR USE  1. Sit on the edge of your bed if possible, or sit up as far as you can in bed or on a chair. 2. Hold the incentive spirometer in an upright position. 3. Breathe out normally. 4. Place the mouthpiece in your mouth and seal your lips tightly around it. 5. Breathe in slowly and as deeply as possible, raising the piston or the ball toward the top of the column. 6. Hold your breath for 3-5 seconds or for as long as possible. Allow the piston or ball to fall to the bottom of the column. 7. Remove the mouthpiece from your mouth and breathe out normally. 8. Rest for a few seconds and repeat Steps 1 through 7 at least 10 times every 1-2 hours when you are awake. Take your time and take a few normal breaths between deep breaths. 9. The spirometer may include an indicator to show your best effort. Use the indicator as a goal to work toward during each repetition. 10. After each set of 10 deep breaths, practice coughing to be sure your lungs are clear. If you have an incision (the cut made at the time of surgery), support your incision when coughing by placing a pillow or rolled up towels firmly  against it. Once you are able to get out of bed, walk around indoors and cough well. You may stop using the incentive spirometer when instructed by your caregiver.  RISKS AND COMPLICATIONS  Take your time so you do not get dizzy or light-headed.  If you are in pain, you may need to take or ask for pain medication before doing incentive spirometry. It is harder to take a deep breath if you are having pain. AFTER USE  Rest and breathe slowly and easily.  It can be helpful to keep track of a log of your progress. Your caregiver can provide you with a simple table to help with this. If you are using the spirometer at home, follow these instructions: [place] IF:   You are having difficultly using the spirometer.  You have trouble using the spirometer as often as instructed.  Your pain medication is not giving enough relief while using the spirometer.  You develop fever of 100.5 F (38.1 C) or higher. SEEK IMMEDIATE MEDICAL CARE IF:   You cough up bloody sputum that had not been present before.  You develop fever of 102 F (38.9 C) or greater.  You develop worsening pain at or near the incision site. MAKE SURE YOU:   Understand these instructions.  Will watch your condition.  Will get help right away if you are not doing well or get worse. Document Released: 09/09/2006 Document Revised: 07/22/2011 Document Reviewed: 11/10/2006 [facility] Patient Information 2014 [place], [place].   ________________________________________________________________________

## 2019-05-27 ENCOUNTER — Encounter (HOSPITAL_COMMUNITY): Payer: Self-pay

## 2019-05-27 ENCOUNTER — Encounter (HOSPITAL_COMMUNITY)
Admission: RE | Admit: 2019-05-27 | Discharge: 2019-05-27 | Disposition: A | Discharge status: Home / Self Care | Payer: No Typology Code available for payment source | Source: Ambulatory Visit | Attending: Orthopedic Surgery | Admitting: Orthopedic Surgery

## 2019-05-27 ENCOUNTER — Other Ambulatory Visit: Payer: Self-pay

## 2019-05-27 DIAGNOSIS — Z01812 Encounter for preprocedural laboratory examination: Secondary | ICD-10-CM | POA: Diagnosis present

## 2019-05-27 HISTORY — DX: Unspecified osteoarthritis, unspecified site: M19.90

## 2019-05-27 NOTE — Progress Notes (Addendum)
PCP - Stephania Fragmin. LOV: 11/2018. Cardiologist -   Chest x-ray -  EKG -  Stress Test -  ECHO -  Cardiac Cath -   Sleep Study -  CPAP -   Fasting Blood Sugar -  Checks Blood Sugar _____ times a day  Blood Thinner Instructions: Aspirin Instructions:RN instructed pt. To call MD's office. And ask when they want him to stop aspirin,since he did not received any instructions yet. Last Dose:  Anesthesia review:   Patient denies shortness of breath, fever, cough and chest pain at PAT appointment   Patient verbalized understanding of instructions that were given to them at the PAT appointment. Patient was also instructed that they will need to review over the PAT instructions again at home before surgery.

## 2019-05-28 ENCOUNTER — Other Ambulatory Visit (HOSPITAL_COMMUNITY)
Admission: RE | Admit: 2019-05-28 | Discharge: 2019-05-28 | Disposition: A | Discharge status: Home / Self Care | Payer: No Typology Code available for payment source | Source: Ambulatory Visit | Attending: Orthopedic Surgery | Admitting: Orthopedic Surgery

## 2019-05-28 ENCOUNTER — Encounter (HOSPITAL_COMMUNITY)
Admission: RE | Admit: 2019-05-28 | Discharge: 2019-05-28 | Disposition: A | Discharge status: Home / Self Care | Payer: No Typology Code available for payment source | Source: Ambulatory Visit | Attending: Orthopedic Surgery | Admitting: Orthopedic Surgery

## 2019-05-28 DIAGNOSIS — Z01812 Encounter for preprocedural laboratory examination: Secondary | ICD-10-CM | POA: Diagnosis not present

## 2019-05-28 LAB — CBC
HCT: 47.2 % (ref 39.0–52.0)
Hemoglobin: 16.4 g/dL (ref 13.0–17.0)
MCH: 32.1 pg (ref 26.0–34.0)
MCHC: 34.7 g/dL (ref 30.0–36.0)
MCV: 92.4 fL (ref 80.0–100.0)
Platelets: 307 10*3/uL (ref 150–400)
RBC: 5.11 MIL/uL (ref 4.22–5.81)
RDW: 11.5 % (ref 11.5–15.5)
WBC: 6.3 10*3/uL (ref 4.0–10.5)
nRBC: 0 % (ref 0.0–0.2)

## 2019-05-28 LAB — SURGICAL PCR SCREEN
MRSA, PCR: NEGATIVE
Staphylococcus aureus: NEGATIVE

## 2019-05-29 LAB — NOVEL CORONAVIRUS, NAA (HOSP ORDER, SEND-OUT TO REF LAB; TAT 18-24 HRS): SARS-CoV-2, NAA: NOT DETECTED

## 2019-06-01 ENCOUNTER — Other Ambulatory Visit: Payer: Self-pay

## 2019-06-01 ENCOUNTER — Inpatient Hospital Stay (HOSPITAL_COMMUNITY)
Admission: RE | Admit: 2019-06-01 | Discharge: 2019-06-02 | DRG: 470 | Disposition: A | Discharge status: Home / Self Care | Payer: No Typology Code available for payment source | Attending: Orthopedic Surgery | Admitting: Orthopedic Surgery

## 2019-06-01 ENCOUNTER — Ambulatory Visit (HOSPITAL_COMMUNITY): Payer: No Typology Code available for payment source | Admitting: Physician Assistant

## 2019-06-01 ENCOUNTER — Encounter (HOSPITAL_COMMUNITY): Payer: Self-pay | Admitting: Orthopedic Surgery

## 2019-06-01 ENCOUNTER — Ambulatory Visit (HOSPITAL_COMMUNITY): Payer: No Typology Code available for payment source

## 2019-06-01 ENCOUNTER — Encounter (HOSPITAL_COMMUNITY)
Admission: RE | Disposition: A | Discharge status: Home / Self Care | Payer: Self-pay | Source: Home / Self Care | Attending: Orthopedic Surgery

## 2019-06-01 DIAGNOSIS — Z79899 Other long term (current) drug therapy: Secondary | ICD-10-CM

## 2019-06-01 DIAGNOSIS — Z96649 Presence of unspecified artificial hip joint: Secondary | ICD-10-CM

## 2019-06-01 DIAGNOSIS — E669 Obesity, unspecified: Secondary | ICD-10-CM | POA: Diagnosis present

## 2019-06-01 DIAGNOSIS — M1612 Unilateral primary osteoarthritis, left hip: Secondary | ICD-10-CM | POA: Diagnosis present

## 2019-06-01 DIAGNOSIS — Z6832 Body mass index (BMI) 32.0-32.9, adult: Secondary | ICD-10-CM | POA: Diagnosis not present

## 2019-06-01 DIAGNOSIS — Z9181 History of falling: Secondary | ICD-10-CM | POA: Diagnosis not present

## 2019-06-01 DIAGNOSIS — Z96641 Presence of right artificial hip joint: Secondary | ICD-10-CM | POA: Diagnosis present

## 2019-06-01 DIAGNOSIS — Z87891 Personal history of nicotine dependence: Secondary | ICD-10-CM

## 2019-06-01 DIAGNOSIS — Z886 Allergy status to analgesic agent status: Secondary | ICD-10-CM

## 2019-06-01 DIAGNOSIS — Z96642 Presence of left artificial hip joint: Secondary | ICD-10-CM

## 2019-06-01 DIAGNOSIS — Z419 Encounter for procedure for purposes other than remedying health state, unspecified: Secondary | ICD-10-CM

## 2019-06-01 HISTORY — PX: TOTAL HIP ARTHROPLASTY: SHX124

## 2019-06-01 LAB — TYPE AND SCREEN
ABO/RH(D): O NEG
Antibody Screen: NEGATIVE

## 2019-06-01 LAB — GLUCOSE, CAPILLARY: Glucose-Capillary: 79 mg/dL (ref 70–99)

## 2019-06-01 SURGERY — ARTHROPLASTY, HIP, TOTAL, ANTERIOR APPROACH
Anesthesia: Spinal | Site: Hip | Laterality: Left

## 2019-06-01 MED ORDER — METHOCARBAMOL 500 MG IVPB - SIMPLE MED
500.0000 mg | Freq: Four times a day (QID) | INTRAVENOUS | Status: DC | PRN
  Administered 2019-06-01: 500 mg via INTRAVENOUS
  Filled 2019-06-01: qty 50

## 2019-06-01 MED ORDER — PANTOPRAZOLE SODIUM 40 MG PO TBEC
40.0000 mg | DELAYED_RELEASE_TABLET | Freq: Every day | ORAL | Status: DC
  Administered 2019-06-02: 40 mg via ORAL
  Filled 2019-06-01 (×2): qty 1

## 2019-06-01 MED ORDER — MEPERIDINE HCL 50 MG/ML IJ SOLN
12.5000 mg | INTRAMUSCULAR | Status: DC | PRN
  Administered 2019-06-01: 12.5 mg via INTRAVENOUS

## 2019-06-01 MED ORDER — PHENOL 1.4 % MT LIQD
1.0000 | OROMUCOSAL | Status: DC | PRN
  Filled 2019-06-01: qty 177

## 2019-06-01 MED ORDER — PHENYLEPHRINE 40 MCG/ML (10ML) SYRINGE FOR IV PUSH (FOR BLOOD PRESSURE SUPPORT)
PREFILLED_SYRINGE | INTRAVENOUS | Status: DC | PRN
  Administered 2019-06-01: 80 ug via INTRAVENOUS

## 2019-06-01 MED ORDER — LACTATED RINGERS IV SOLN: INTRAVENOUS | Status: DC

## 2019-06-01 MED ORDER — METOCLOPRAMIDE HCL 5 MG/ML IJ SOLN: 5.0000 mg | Freq: Three times a day (TID) | INTRAMUSCULAR | Status: DC | PRN

## 2019-06-01 MED ORDER — ONDANSETRON HCL 4 MG/2ML IJ SOLN: 4.0000 mg | Freq: Four times a day (QID) | INTRAMUSCULAR | Status: DC | PRN

## 2019-06-01 MED ORDER — OXYCODONE HCL 5 MG PO TABS
5.0000 mg | ORAL_TABLET | ORAL | Status: DC | PRN
  Administered 2019-06-01: 5 mg via ORAL

## 2019-06-01 MED ORDER — MEPERIDINE HCL 50 MG/ML IJ SOLN
INTRAMUSCULAR | Status: AC
  Filled 2019-06-01: qty 1

## 2019-06-01 MED ORDER — ASPIRIN 81 MG PO CHEW: 81.0000 mg | CHEWABLE_TABLET | Freq: Two times a day (BID) | ORAL | 0 refills | Status: AC

## 2019-06-01 MED ORDER — ONDANSETRON HCL 4 MG/2ML IJ SOLN
INTRAMUSCULAR | Status: DC | PRN
  Administered 2019-06-01: 4 mg via INTRAVENOUS

## 2019-06-01 MED ORDER — METHOCARBAMOL 500 MG PO TABS: 500.0000 mg | ORAL_TABLET | Freq: Four times a day (QID) | ORAL | 0 refills | Status: DC | PRN

## 2019-06-01 MED ORDER — OXYCODONE HCL 5 MG PO TABS: 5.0000 mg | ORAL_TABLET | Freq: Once | ORAL | Status: DC | PRN

## 2019-06-01 MED ORDER — PHENYLEPHRINE HCL-NACL 10-0.9 MG/250ML-% IV SOLN
INTRAVENOUS | Status: DC | PRN
  Administered 2019-06-01: 50 ug/min via INTRAVENOUS

## 2019-06-01 MED ORDER — SODIUM CHLORIDE 0.9 % IV SOLN: INTRAVENOUS | Status: DC

## 2019-06-01 MED ORDER — TRANEXAMIC ACID-NACL 1000-0.7 MG/100ML-% IV SOLN
1000.0000 mg | INTRAVENOUS | Status: AC
  Administered 2019-06-01: 1000 mg via INTRAVENOUS
  Filled 2019-06-01: qty 100

## 2019-06-01 MED ORDER — FENTANYL CITRATE (PF) 100 MCG/2ML IJ SOLN
INTRAMUSCULAR | Status: AC
  Filled 2019-06-01: qty 2

## 2019-06-01 MED ORDER — PHENYLEPHRINE HCL (PRESSORS) 10 MG/ML IV SOLN
INTRAVENOUS | Status: AC
  Filled 2019-06-01: qty 1

## 2019-06-01 MED ORDER — CEFAZOLIN SODIUM-DEXTROSE 2-4 GM/100ML-% IV SOLN
2.0000 g | INTRAVENOUS | Status: AC
  Administered 2019-06-01: 2 g via INTRAVENOUS
  Filled 2019-06-01: qty 100

## 2019-06-01 MED ORDER — OXYCODONE HCL 5 MG PO TABS
10.0000 mg | ORAL_TABLET | ORAL | Status: DC | PRN
  Administered 2019-06-01 – 2019-06-02 (×4): 10 mg via ORAL
  Filled 2019-06-01 (×4): qty 2

## 2019-06-01 MED ORDER — FENTANYL CITRATE (PF) 100 MCG/2ML IJ SOLN
25.0000 ug | INTRAMUSCULAR | Status: DC | PRN
  Administered 2019-06-01 (×2): 50 ug via INTRAVENOUS

## 2019-06-01 MED ORDER — OXYCODONE HCL 5 MG PO TABS: 5.0000 mg | ORAL_TABLET | ORAL | 0 refills | Status: AC | PRN

## 2019-06-01 MED ORDER — PROPOFOL 500 MG/50ML IV EMUL
INTRAVENOUS | Status: DC | PRN
  Administered 2019-06-01: 125 ug/kg/min via INTRAVENOUS

## 2019-06-01 MED ORDER — BISACODYL 10 MG RE SUPP: 10.0000 mg | Freq: Every day | RECTAL | Status: DC | PRN

## 2019-06-01 MED ORDER — LACTATED RINGERS IV SOLN: INTRAVENOUS | Status: DC | PRN

## 2019-06-01 MED ORDER — METHOCARBAMOL 500 MG PO TABS
500.0000 mg | ORAL_TABLET | Freq: Four times a day (QID) | ORAL | Status: DC | PRN
  Administered 2019-06-01 – 2019-06-02 (×2): 500 mg via ORAL
  Filled 2019-06-01 (×2): qty 1

## 2019-06-01 MED ORDER — DEXAMETHASONE SODIUM PHOSPHATE 10 MG/ML IJ SOLN
10.0000 mg | Freq: Once | INTRAMUSCULAR | Status: AC
  Administered 2019-06-02: 10 mg via INTRAVENOUS
  Filled 2019-06-01: qty 1

## 2019-06-01 MED ORDER — MIDAZOLAM HCL 5 MG/5ML IJ SOLN
INTRAMUSCULAR | Status: DC | PRN
  Administered 2019-06-01: 2 mg via INTRAVENOUS

## 2019-06-01 MED ORDER — TRANEXAMIC ACID-NACL 1000-0.7 MG/100ML-% IV SOLN: 1000.0000 mg | Freq: Once | INTRAVENOUS | Status: DC

## 2019-06-01 MED ORDER — PROPOFOL 500 MG/50ML IV EMUL
INTRAVENOUS | Status: AC
  Filled 2019-06-01: qty 50

## 2019-06-01 MED ORDER — DIPHENHYDRAMINE HCL 12.5 MG/5ML PO ELIX: 12.5000 mg | ORAL_SOLUTION | ORAL | Status: DC | PRN

## 2019-06-01 MED ORDER — OXYCODONE HCL 5 MG PO TABS
ORAL_TABLET | ORAL | Status: AC
  Filled 2019-06-01: qty 1

## 2019-06-01 MED ORDER — DEXAMETHASONE SODIUM PHOSPHATE 10 MG/ML IJ SOLN: 10.0000 mg | Freq: Once | INTRAMUSCULAR | Status: DC

## 2019-06-01 MED ORDER — FENTANYL CITRATE (PF) 100 MCG/2ML IJ SOLN
INTRAMUSCULAR | Status: DC | PRN
  Administered 2019-06-01: 100 ug via INTRAVENOUS

## 2019-06-01 MED ORDER — DOCUSATE SODIUM 100 MG PO CAPS: 100.0000 mg | ORAL_CAPSULE | Freq: Two times a day (BID) | ORAL | Status: AC

## 2019-06-01 MED ORDER — PHENYLEPHRINE 40 MCG/ML (10ML) SYRINGE FOR IV PUSH (FOR BLOOD PRESSURE SUPPORT)
PREFILLED_SYRINGE | INTRAVENOUS | Status: AC
  Filled 2019-06-01: qty 10

## 2019-06-01 MED ORDER — MENTHOL 3 MG MT LOZG: 1.0000 | LOZENGE | OROMUCOSAL | Status: DC | PRN

## 2019-06-01 MED ORDER — MIDAZOLAM HCL 2 MG/2ML IJ SOLN
INTRAMUSCULAR | Status: AC
  Filled 2019-06-01: qty 2

## 2019-06-01 MED ORDER — PROMETHAZINE HCL 25 MG/ML IJ SOLN: 6.2500 mg | INTRAMUSCULAR | Status: DC | PRN

## 2019-06-01 MED ORDER — ACETAMINOPHEN 325 MG PO TABS
325.0000 mg | ORAL_TABLET | Freq: Four times a day (QID) | ORAL | Status: DC | PRN
  Administered 2019-06-02: 650 mg via ORAL
  Filled 2019-06-01: qty 2

## 2019-06-01 MED ORDER — LIDOCAINE HCL (CARDIAC) PF 100 MG/5ML IV SOSY
PREFILLED_SYRINGE | INTRAVENOUS | Status: DC | PRN
  Administered 2019-06-01: 100 mg via INTRAVENOUS

## 2019-06-01 MED ORDER — OXYCODONE HCL 5 MG/5ML PO SOLN: 5.0000 mg | Freq: Once | ORAL | Status: DC | PRN

## 2019-06-01 MED ORDER — CEFAZOLIN SODIUM-DEXTROSE 2-4 GM/100ML-% IV SOLN
2.0000 g | Freq: Four times a day (QID) | INTRAVENOUS | Status: AC
  Administered 2019-06-01: 2 g via INTRAVENOUS
  Filled 2019-06-01: qty 100

## 2019-06-01 MED ORDER — FERROUS SULFATE 325 (65 FE) MG PO TABS
325.0000 mg | ORAL_TABLET | Freq: Three times a day (TID) | ORAL | Status: DC
  Administered 2019-06-01 – 2019-06-02 (×2): 325 mg via ORAL
  Filled 2019-06-01 (×2): qty 1

## 2019-06-01 MED ORDER — LACTATED RINGERS IV BOLUS: 250.0000 mL | Freq: Once | INTRAVENOUS | Status: DC

## 2019-06-01 MED ORDER — METOCLOPRAMIDE HCL 5 MG PO TABS: 5.0000 mg | ORAL_TABLET | Freq: Three times a day (TID) | ORAL | Status: DC | PRN

## 2019-06-01 MED ORDER — LACTATED RINGERS IV BOLUS
500.0000 mL | Freq: Once | INTRAVENOUS | Status: AC
  Administered 2019-06-01: 500 mL via INTRAVENOUS

## 2019-06-01 MED ORDER — POLYETHYLENE GLYCOL 3350 17 G PO PACK
17.0000 g | PACK | Freq: Two times a day (BID) | ORAL | Status: DC
  Administered 2019-06-01 – 2019-06-02 (×2): 17 g via ORAL
  Filled 2019-06-01 (×2): qty 1

## 2019-06-01 MED ORDER — MORPHINE SULFATE (PF) 2 MG/ML IV SOLN: 0.5000 mg | INTRAVENOUS | Status: DC | PRN

## 2019-06-01 MED ORDER — CHLORHEXIDINE GLUCONATE 4 % EX LIQD: 60.0000 mL | Freq: Once | CUTANEOUS | Status: DC

## 2019-06-01 MED ORDER — ALUM & MAG HYDROXIDE-SIMETH 200-200-20 MG/5ML PO SUSP: 15.0000 mL | ORAL | Status: DC | PRN

## 2019-06-01 MED ORDER — METHOCARBAMOL 500 MG PO TABS: 500.0000 mg | ORAL_TABLET | Freq: Four times a day (QID) | ORAL | 0 refills | Status: AC | PRN

## 2019-06-01 MED ORDER — PHENYLEPHRINE 40 MCG/ML (10ML) SYRINGE FOR IV PUSH (FOR BLOOD PRESSURE SUPPORT)
PREFILLED_SYRINGE | INTRAVENOUS | Status: AC
  Filled 2019-06-01: qty 20

## 2019-06-01 MED ORDER — MAGNESIUM CITRATE PO SOLN: 1.0000 | Freq: Once | ORAL | Status: DC | PRN

## 2019-06-01 MED ORDER — DEXAMETHASONE SODIUM PHOSPHATE 10 MG/ML IJ SOLN
INTRAMUSCULAR | Status: DC | PRN
  Administered 2019-06-01: 8 mg via INTRAVENOUS

## 2019-06-01 MED ORDER — PROPOFOL 1000 MG/100ML IV EMUL
INTRAVENOUS | Status: AC
  Filled 2019-06-01: qty 100

## 2019-06-01 MED ORDER — MEPIVACAINE HCL (PF) 2 % IJ SOLN
INTRAMUSCULAR | Status: DC | PRN
  Administered 2019-06-01: 3.6 mL via INTRATHECAL

## 2019-06-01 MED ORDER — DOCUSATE SODIUM 100 MG PO CAPS
100.0000 mg | ORAL_CAPSULE | Freq: Two times a day (BID) | ORAL | Status: DC
  Administered 2019-06-01 – 2019-06-02 (×2): 100 mg via ORAL
  Filled 2019-06-01 (×2): qty 1

## 2019-06-01 MED ORDER — ASPIRIN 81 MG PO CHEW
81.0000 mg | CHEWABLE_TABLET | Freq: Two times a day (BID) | ORAL | Status: DC
  Administered 2019-06-01 – 2019-06-02 (×2): 81 mg via ORAL
  Filled 2019-06-01 (×2): qty 1

## 2019-06-01 MED ORDER — ONDANSETRON HCL 4 MG PO TABS: 4.0000 mg | ORAL_TABLET | Freq: Four times a day (QID) | ORAL | Status: DC | PRN

## 2019-06-01 MED ORDER — SODIUM CHLORIDE 0.9 % IR SOLN
Status: DC | PRN
  Administered 2019-06-01: 1000 mL

## 2019-06-01 MED ORDER — PROPOFOL 10 MG/ML IV BOLUS
INTRAVENOUS | Status: DC | PRN
  Administered 2019-06-01 (×2): 20 mg via INTRAVENOUS

## 2019-06-01 MED ORDER — EPHEDRINE 5 MG/ML INJ
INTRAVENOUS | Status: AC
  Filled 2019-06-01: qty 10

## 2019-06-01 MED ORDER — METHOCARBAMOL 500 MG IVPB - SIMPLE MED
INTRAVENOUS | Status: AC
  Filled 2019-06-01: qty 50

## 2019-06-01 MED ORDER — POLYETHYLENE GLYCOL 3350 17 G PO PACK: 17.0000 g | PACK | Freq: Two times a day (BID) | ORAL | 0 refills | Status: AC

## 2019-06-01 SURGICAL SUPPLY — 47 items
BAG DECANTER FOR FLEXI CONT (MISCELLANEOUS) IMPLANT
BAG ZIPLOCK 12X15 (MISCELLANEOUS) IMPLANT
BLADE SAG 18X100X1.27 (BLADE) ×3 IMPLANT
BLADE SURG SZ10 CARB STEEL (BLADE) ×6 IMPLANT
COVER PERINEAL POST (MISCELLANEOUS) ×3 IMPLANT
COVER SURGICAL LIGHT HANDLE (MISCELLANEOUS) ×3 IMPLANT
COVER WAND RF STERILE (DRAPES) ×2 IMPLANT
CUP ACET PINNACLE SECTR 56MM (Hips) IMPLANT
DERMABOND ADVANCED (GAUZE/BANDAGES/DRESSINGS) ×2
DERMABOND ADVANCED .7 DNX12 (GAUZE/BANDAGES/DRESSINGS) ×1 IMPLANT
DRAPE STERI IOBAN 125X83 (DRAPES) ×3 IMPLANT
DRAPE U-SHAPE 47X51 STRL (DRAPES) ×6 IMPLANT
DRESSING AQUACEL AG SP 3.5X10 (GAUZE/BANDAGES/DRESSINGS) ×1 IMPLANT
DRSG AQUACEL AG SP 3.5X10 (GAUZE/BANDAGES/DRESSINGS) ×3
DURAPREP 26ML APPLICATOR (WOUND CARE) ×3 IMPLANT
ELECT BLADE TIP CTD 4 INCH (ELECTRODE) ×3 IMPLANT
ELECT REM PT RETURN 15FT ADLT (MISCELLANEOUS) ×3 IMPLANT
ELIMINATOR HOLE APEX DEPUY (Hips) ×2 IMPLANT
GLOVE BIO SURGEON STRL SZ 6 (GLOVE) ×6 IMPLANT
GLOVE BIOGEL PI IND STRL 6.5 (GLOVE) ×1 IMPLANT
GLOVE BIOGEL PI IND STRL 7.5 (GLOVE) ×1 IMPLANT
GLOVE BIOGEL PI IND STRL 8.5 (GLOVE) ×1 IMPLANT
GLOVE BIOGEL PI INDICATOR 6.5 (GLOVE) ×2
GLOVE BIOGEL PI INDICATOR 7.5 (GLOVE) ×2
GLOVE BIOGEL PI INDICATOR 8.5 (GLOVE) ×2
GLOVE ECLIPSE 8.0 STRL XLNG CF (GLOVE) ×6 IMPLANT
GLOVE ORTHO TXT STRL SZ7.5 (GLOVE) ×6 IMPLANT
GOWN STRL REUS W/TWL LRG LVL3 (GOWN DISPOSABLE) ×6 IMPLANT
GOWN STRL REUS W/TWL XL LVL3 (GOWN DISPOSABLE) ×3 IMPLANT
HEAD CERAMIC DELTA 36 PLUS 1.5 (Hips) ×2 IMPLANT
HOLDER FOLEY CATH W/STRAP (MISCELLANEOUS) ×3 IMPLANT
KIT TURNOVER KIT A (KITS) IMPLANT
PACK ANTERIOR HIP CUSTOM (KITS) ×3 IMPLANT
PENCIL SMOKE EVACUATOR (MISCELLANEOUS) ×2 IMPLANT
PINNACLE ALTRX PLUS 4 N 36X56 (Hips) ×2 IMPLANT
PINNACLE SECTOR CUP 56MM (Hips) ×3 IMPLANT
SCREW 6.5MMX30MM (Screw) ×2 IMPLANT
STEM FEM ACTIS HIGH SZ7 (Stem) ×2 IMPLANT
SUT MNCRL AB 4-0 PS2 18 (SUTURE) ×3 IMPLANT
SUT STRATAFIX 0 PDS 27 VIOLET (SUTURE) ×3
SUT VIC AB 1 CT1 36 (SUTURE) ×9 IMPLANT
SUT VIC AB 2-0 CT1 27 (SUTURE) ×4
SUT VIC AB 2-0 CT1 TAPERPNT 27 (SUTURE) ×2 IMPLANT
SUTURE STRATFX 0 PDS 27 VIOLET (SUTURE) ×1 IMPLANT
TRAY FOLEY MTR SLVR 16FR STAT (SET/KITS/TRAYS/PACK) ×2 IMPLANT
WATER STERILE IRR 1000ML POUR (IV SOLUTION) ×5 IMPLANT
YANKAUER SUCT BULB TIP 10FT TU (MISCELLANEOUS) ×2 IMPLANT

## 2019-06-01 NOTE — Interval H&P Note (Signed)
History and Physical Interval Note:  06/01/2019 9:55 AM  Kenneth Yoder  has presented today for surgery, with the diagnosis of Left hip osteoarthritis.  The various methods of treatment have been discussed with the patient and family. After consideration of risks, benefits and other options for treatment, the patient has consented to  Procedure(s) with comments: TOTAL HIP ARTHROPLASTY ANTERIOR APPROACH (Left) - 70 mins as a surgical intervention.  The patient's history has been reviewed, patient examined, no change in status, stable for surgery.  I have reviewed the patient's chart and labs.  Questions were answered to the patient's satisfaction.     Shelda Pal

## 2019-06-01 NOTE — Anesthesia Preprocedure Evaluation (Signed)
Anesthesia Evaluation  Patient identified by MRN, date of birth, ID band Patient awake    Reviewed: Allergy & Precautions, NPO status , Patient's Chart, lab work & pertinent test results  Airway Mallampati: II  TM Distance: >3 FB Neck ROM: Full    Dental no notable dental hx.    Pulmonary neg pulmonary ROS, former smoker,    Pulmonary exam normal breath sounds clear to auscultation       Cardiovascular negative cardio ROS Normal cardiovascular exam Rhythm:Regular Rate:Normal     Neuro/Psych negative neurological ROS  negative psych ROS   GI/Hepatic negative GI ROS, Neg liver ROS,   Endo/Other  negative endocrine ROS  Renal/GU negative Renal ROS  negative genitourinary   Musculoskeletal  (+) Arthritis , Osteoarthritis,    Abdominal   Peds negative pediatric ROS (+)  Hematology negative hematology ROS (+)   Anesthesia Other Findings   Reproductive/Obstetrics negative OB ROS                             Anesthesia Physical Anesthesia Plan  ASA: I  Anesthesia Plan: Spinal   Post-op Pain Management:    Induction: Intravenous  PONV Risk Score and Plan: 2 and Ondansetron, Dexamethasone and Treatment may vary due to age or medical condition  Airway Management Planned: Simple Face Mask  Additional Equipment:   Intra-op Plan:   Post-operative Plan:   Informed Consent: I have reviewed the patients History and Physical, chart, labs and discussed the procedure including the risks, benefits and alternatives for the proposed anesthesia with the patient or authorized representative who has indicated his/her understanding and acceptance.     Dental advisory given  Plan Discussed with: CRNA and Surgeon  Anesthesia Plan Comments:         Anesthesia Quick Evaluation

## 2019-06-01 NOTE — Anesthesia Procedure Notes (Signed)
Spinal  Patient location during procedure: OR End time: 06/01/2019 11:25 AM Staffing Performed: resident/CRNA  Resident/CRNA: Caryl Pina T, CRNA Preanesthetic Checklist Completed: patient identified, IV checked, site marked, risks and benefits discussed, surgical consent, monitors and equipment checked, pre-op evaluation and timeout performed Spinal Block Patient position: sitting Prep: DuraPrep Patient monitoring: heart rate, cardiac monitor, continuous pulse ox and blood pressure Approach: midline Location: L3-4 Injection technique: single-shot Needle Needle type: Sprotte and Pencan  Needle gauge: 24 G Needle length: 9 cm Assessment Sensory level: T4 Additional Notes Expiration date of kit checked and confirmed. Patient tolerated procedure well, without complications.

## 2019-06-01 NOTE — Discharge Instructions (Signed)

## 2019-06-01 NOTE — Transfer of Care (Signed)
Immediate Anesthesia Transfer of Care Note  Patient: Kenneth Yoder  Procedure(s) Performed: TOTAL HIP ARTHROPLASTY ANTERIOR APPROACH (Left Hip)  Patient Location: PACU  Anesthesia Type:MAC and Spinal  Level of Consciousness: awake, alert , oriented and patient cooperative  Airway & Oxygen Therapy: Patient Spontanous Breathing and Patient connected to face mask oxygen  Post-op Assessment: Report given to RN and Post -op Vital signs reviewed and stable  Post vital signs: Reviewed and stable  Last Vitals:  Vitals Value Taken Time  BP 124/85 06/01/19 1308  Temp    Pulse 73 06/01/19 1309  Resp 14 06/01/19 1309  SpO2 100 % 06/01/19 1309  Vitals shown include unvalidated device data.  Last Pain:  Vitals:   06/01/19 0929  TempSrc:   PainSc: 4       Patients Stated Pain Goal: 3 (85/88/50 2774)  Complications: No apparent anesthesia complications

## 2019-06-01 NOTE — Op Note (Signed)
NAME:  Kenneth Yoder                ACCOUNT NO.: [id number]      MEDICAL RECORD NO.: [id number]      FACILITY:  [facility]      PHYSICIAN:  Shelda Pal  DATE OF BIRTH:  [birth year]     DATE OF PROCEDURE:  06/01/2019                                 OPERATIVE REPORT         PREOPERATIVE DIAGNOSIS: Left  hip osteoarthritis.      POSTOPERATIVE DIAGNOSIS:  Left hip osteoarthritis.      PROCEDURE:  Left total hip replacement through an anterior approach   utilizing DePuy THR system, component size 46mm pinnacle cup, a size 36+4 neutral   Altrex liner, a size 7 Hi Tri Lock stem with a 36+1.5 delta ceramic   ball.      SURGEON:  Madlyn Frankel. Charlann Boxer, M.D.      ASSISTANT:  Lanney Gins, PA-C     ANESTHESIA:  Spinal.      SPECIMENS:  None.      COMPLICATIONS:  None.      BLOOD LOSS:  500 cc     DRAINS:  None.      INDICATION OF THE PROCEDURE:  Kenneth Yoder is a 49 y.o. male who had   presented to office for evaluation of left hip pain.  Radiographs revealed   progressive degenerative changes with bone-on-bone   articulation of the  hip joint, including subchondral cystic changes and osteophytes.  The patient had painful limited range of   motion significantly affecting their overall quality of life and function.  The patient was failing to    respond to conservative measures including medications and/or injections and activity modification and at this point was ready   to proceed with more definitive measures.  Consent was obtained for   benefit of pain relief.  Specific risks of infection, DVT, component   failure, dislocation, neurovascular injury, and need for revision surgery were reviewed in the office as well discussion of   the anterior versus posterior approach were reviewed.     PROCEDURE IN DETAIL:  The patient was brought to operative theater.   Once adequate anesthesia, preoperative antibiotics, 2 gm of Ancef, 1 gm of Tranexamic Acid, and 10  mg of Decadron were administered, the patient was positioned supine on the [employer] table.  Once the patient was safely positioned with adequate padding of boney prominences we predraped out the hip, and used fluoroscopy to confirm orientation of the pelvis.      The left hip was then prepped and draped from proximal iliac crest to   mid thigh with a shower curtain technique.      Time-out was performed identifying the patient, planned procedure, and the appropriate extremity.     An incision was then made 2 cm lateral to the   anterior superior iliac spine extending over the orientation of the   tensor fascia lata muscle and sharp dissection was carried down to the   fascia of the muscle.      The fascia was then incised.  The muscle belly was identified and swept   laterally and retractor placed along the superior neck.  Following   cauterization of the circumflex vessels and removing some pericapsular  fat, a second cobra retractor was placed on the inferior neck.  A T-capsulotomy was made along the line of the   superior neck to the trochanteric fossa, then extended proximally and   distally.  Tag sutures were placed and the retractors were then placed   intracapsular.  We then identified the trochanteric fossa and   orientation of my neck cut and then made a neck osteotomy with the femur on traction.  The femoral   head was removed without difficulty or complication.  Traction was let   off and retractors were placed posterior and anterior around the   acetabulum.      The labrum and foveal tissue were debrided.  I began reaming with a 48 mm   reamer and reamed up to 55 mm reamer with good bony bed preparation and a 56 mm  cup was chosen.  The final 56 mm Pinnacle cup was then impacted under fluoroscopy to confirm the depth of penetration and orientation with respect to   Abduction and forward flexion.  A screw was placed into the ilium followed by the hole eliminator.  The final    36+4 neutral Altrex liner was impacted with good visualized rim fit.  The cup was positioned anatomically within the acetabular portion of the pelvis.      At this point, the femur was rolled to 100 degrees.  Further capsule was   released off the inferior aspect of the femoral neck.  I then   released the superior capsule proximally.  With the leg in a neutral position the hook was placed laterally   along the femur under the vastus lateralis origin and elevated manually and then held in position using the hook attachment on the bed.  The leg was then extended and adducted with the leg rolled to 100   degrees of external rotation.  Retractors were placed along the medial calcar and posteriorly over the greater trochanter.  Once the proximal femur was fully   exposed, I used a box osteotome to set orientation.  I then began   broaching with the starting chili pepper broach and passed this by hand and then broached up to 7.  With the 7 broach in place I chose a high offset neck and did several trial reductions.  The offset was appropriate, leg lengths   appeared to be equal best matched with the +1.5 head ball trial confirmed radiographically.   Given these findings, I went ahead and dislocated the hip, repositioned all   retractors and positioned the right hip in the extended and abducted position.  The final 7Hi Tri Lock stem was   chosen and it was impacted down to the level of neck cut.  Based on this   and the trial reductions, a final 36+1.5 delta ceramic ball was chosen and   impacted onto a clean and dry trunnion, and the hip was reduced.  The   hip had been irrigated throughout the case again at this point.  I did   reapproximate the superior capsular leaflet to the anterior leaflet   using #1 Vicryl.  The fascia of the   tensor fascia lata muscle was then reapproximated using #1 Vicryl and #0 Stratafix sutures.  The   remaining wound was closed with 2-0 Vicryl and running 4-0 Monocryl.    The hip was cleaned, dried, and dressed sterilely using Dermabond and   Aquacel dressing.  The patient was then brought   to recovery room in  stable condition tolerating the procedure well.    Danae Orleans, PA-C was present for the entirety of the case involved from   preoperative positioning, perioperative retractor management, general   facilitation of the case, as well as primary wound closure as assistant.            Pietro Cassis Alvan Dame, M.D.        06/01/2019 12:48 PM

## 2019-06-01 NOTE — Evaluation (Signed)
Physical Therapy Evaluation Patient Details Name: Kenneth Yoder MRN: [id number] DOB: 1970/09/24 Today's Date: 06/01/2019   History of Present Illness  Patient is 49 y.o. male s/p Lt THA anterior approach on 06/01/19 with PMH significant for Rt THA in 2019 and h/o trauma to bil hips while in [employer], history of PTSD (pain is a trigger), history of plantar fasciatis, and depression.    Clinical Impression  [name] is a 49 y.o. male POD 0 s/p Lt THA anterior approach. Patient reports independence with mobility at baseline. Patient is now limited by functional impairments (see PT problem list below) and requires min assist for bed mobility and transfers with RW. Patient was limited by symptomatic hypotension and was unable to progress to gait today. Patient will benefit from continued skilled PT interventions to address impairments and progress towards PLOF. Acute PT will follow to progress mobility and stair training in preparation for safe discharge home.     Follow Up Recommendations Follow surgeon's recommendation for DC plan and follow-up therapies    Equipment Recommendations  None recommended by PT    Recommendations for Other Services       Precautions / Restrictions Precautions Precautions: Fall Restrictions Weight Bearing Restrictions: No      Mobility  Bed Mobility Overal bed mobility: Needs Assistance Bed Mobility: Supine to Sit;Sit to Supine     Supine to sit: HOB elevated;Min assist Sit to supine: HOB elevated;Min assist   General bed mobility comments: cues for use fo gait belt or Rt LE to assist with Lt LE mobility, min assist required for Lt LE mobility as pt was limited by pain.  Transfers Overall transfer level: Needs assistance Equipment used: Rolling walker (2 wheeled) Transfers: Sit to/from Stand Sit to Stand: Min assist         General transfer comment: min assist/close guard for safety and to steady. cues for safe hand placement and  technique with RW, and light assist for power up. pt reported reducd hip pain upon standing. Knee flexion performed in standing at EOB to help with pain. Pt reported dizziness/nausea in standing and BP noted to have slight drop and pt then assist back to supine. SBP imporved from 120's to 140's with return to supine.  Ambulation/Gait                Stairs            Wheelchair Mobility    Modified Rankin (Stroke Patients Only)       Balance Overall balance assessment: Needs assistance Sitting-balance support: Feet supported Sitting balance-Leahy Scale: Good     Standing balance support: Bilateral upper extremity supported;During functional activity Standing balance-Leahy Scale: Fair              Pertinent Vitals/Pain Pain Assessment: Faces Faces Pain Scale: Hurts whole lot Pain Location: Lt hip Pain Descriptors / Indicators: Grimacing;Guarding Pain Intervention(s): Limited activity within patient's tolerance;Repositioned;Monitored during session    Home Living Family/patient expects to be discharged to:: Private residence Living Arrangements: Parent;Children Available Help at Discharge: Family;Available 24 hours/day Type of Home: House Home Access: Stairs to enter Entrance Stairs-Rails: None Entrance Stairs-Number of Steps: 6 stesp platform like down from outsdie and up 2 into the house no rails 3(2 shorter steps built up and tehn a threshold at door) Home Layout: One level;Multi-level;Able to live on main level with bedroom/bathroom Home Equipment: Toilet riser;Walker - 4 wheels;Cane - single point      Prior Function Level of Independence: Independent with  assistive device(s)         Comments: uses teh spc to ambuate     Hand Dominance   Dominant Hand: Right    Extremity/Trunk Assessment   Upper Extremity Assessment Upper Extremity Assessment: Overall WFL for tasks assessed    Lower Extremity Assessment Lower Extremity Assessment: LLE  deficits/detail LLE: Unable to fully assess due to pain LLE Sensation: WNL LLE Coordination: WNL    Cervical / Trunk Assessment Cervical / Trunk Assessment: Normal  Communication   Communication: No difficulties  Cognition Arousal/Alertness: Awake/alert Behavior During Therapy: WFL for tasks assessed/performed Overall Cognitive Status: Within Functional Limits for tasks assessed                 General Comments      Exercises Total Joint Exercises Knee Flexion: AROM;5 reps;Standing;Left   Assessment/Plan    PT Assessment Patient needs continued PT services  PT Problem List Decreased balance;Decreased strength;Decreased range of motion;Decreased mobility;Decreased activity tolerance;Decreased knowledge of use of DME       PT Treatment Interventions DME instruction;Functional mobility training;Patient/family education;Balance training;Therapeutic activities;Gait training;Stair training;Therapeutic exercise    PT Goals (Current goals can be found in the Care Plan section)  Acute Rehab PT Goals Patient Stated Goal: to get back independence and get home PT Goal Formulation: With patient Time For Goal Achievement: 06/08/19 Potential to Achieve Goals: Good    Frequency 7X/week    AM-PAC PT "6 Clicks" Mobility  Outcome Measure Help needed turning from your back to your side while in a flat bed without using bedrails?: A Little Help needed moving from lying on your back to sitting on the side of a flat bed without using bedrails?: A Little Help needed moving to and from a bed to a chair (including a wheelchair)?: A Little Help needed standing up from a chair using your arms (e.g., wheelchair or bedside chair)?: A Little Help needed to walk in hospital room?: A Lot Help needed climbing 3-5 steps with a railing? : A Lot 6 Click Score: 16    End of Session Equipment Utilized During Treatment: Gait belt Activity Tolerance: Patient tolerated treatment well Patient left:  in bed;with call bell/phone within reach Nurse Communication: Mobility status PT Visit Diagnosis: Muscle weakness (generalized) (M62.81);Difficulty in walking, not elsewhere classified (R26.2)    Time: 0623-7628 PT Time Calculation (min) (ACUTE ONLY): 22 min   Charges:   PT Evaluation $PT Eval Low Complexity: 1 Low          Verner Mould, DPT Physical Therapist with [facility] [phone]  06/01/2019 7:43 PM

## 2019-06-02 ENCOUNTER — Encounter: Payer: Self-pay | Admitting: *Deleted

## 2019-06-02 LAB — BASIC METABOLIC PANEL
Anion gap: 9 (ref 5–15)
BUN: 10 mg/dL (ref 6–20)
CO2: 23 mmol/L (ref 22–32)
Calcium: 9.1 mg/dL (ref 8.9–10.3)
Chloride: 107 mmol/L (ref 98–111)
Creatinine, Ser: 0.9 mg/dL (ref 0.61–1.24)
GFR calc Af Amer: >60 mL/min (ref >60)
GFR calc non Af Amer: >60 mL/min (ref >60)
Glucose, Bld: 134 mg/dL — ABNORMAL HIGH (ref 70–99)
Potassium: 4.2 mmol/L (ref 3.5–5.1)
Sodium: 139 mmol/L (ref 135–145)

## 2019-06-02 LAB — CBC
HCT: 42.5 % (ref 39.0–52.0)
Hemoglobin: 14.6 g/dL (ref 13.0–17.0)
MCH: 32 pg (ref 26.0–34.0)
MCHC: 34.4 g/dL (ref 30.0–36.0)
MCV: 93.2 fL (ref 80.0–100.0)
Platelets: 299 10*3/uL (ref 150–400)
RBC: 4.56 MIL/uL (ref 4.22–5.81)
RDW: 11.6 % (ref 11.5–15.5)
WBC: 17.8 10*3/uL — ABNORMAL HIGH (ref 4.0–10.5)
nRBC: 0 % (ref 0.0–0.2)

## 2019-06-02 NOTE — TOC Transition Note (Signed)
Transition of Care [facility]) - CM/SW Discharge Note   Patient Details  Name: Kenneth Yoder MRN: [id number] Date of Birth: 03-Oct-1970  Transition of Care [facility] CM/SW Contact:  Clearance Coots, LCSW Phone Number: 06/02/2019, 11:40 AM   Clinical Narrative:    RW ordered through [place].  Application complete and faxed with requested clinical information [phone]. Per [facility] case manager the RW should be delivered in 2-3 days.  Patient has a Rolator that he will use until the 2 wheel RW walker is delivered to his home. CSW confirm patient address.    Final next level of care: Home/Self Care Barriers to Discharge: No Barriers Identified   Patient Goals and CMS Choice        Discharge Placement                       Discharge Plan and Services                DME Arranged: Walker rolling DME Agency: Other - Comment([facility] Sharper) Date DME Agency Contacted: 06/02/19 Time DME Agency Contacted: 1140 Representative spoke with at DME Agency: Marquita            Social Determinants of Health (SDOH) Interventions     Readmission Risk Interventions No flowsheet data found.

## 2019-06-02 NOTE — Plan of Care (Signed)
Plan of care discussed.   

## 2019-06-02 NOTE — Progress Notes (Signed)
     Subjective: 1 Day Post-Op Procedure(s) (LRB): TOTAL HIP ARTHROPLASTY ANTERIOR APPROACH (Left)   Patient reports pain as mild, pain controlled. Other than not sleeping well, no other reported events throughout the night. Discussed the procedure, findings and expectations moving forward. Ready to be discharged home if he does well with PT.    Objective:   VITALS:   Vitals:   06/02/19 0244 06/02/19 0644  BP: (!) 149/98 139/81  Pulse: 82 83  Resp: 15 18  Temp: 98.4 F (36.9 C) 98.2 F (36.8 C)  SpO2: 99% 100%    Dorsiflexion/Plantar flexion intact Incision: dressing C/D/I No cellulitis present Compartment soft  LABS Recent Labs    06/02/19 0424  HGB 14.6  HCT 42.5  WBC 17.8*  PLT 299    Recent Labs    06/02/19 0424  NA 139  K 4.2  BUN 10  CREATININE 0.90  GLUCOSE 134*     Assessment/Plan: 1 Day Post-Op Procedure(s) (LRB): TOTAL HIP ARTHROPLASTY ANTERIOR APPROACH (Left) Advance diet Up with therapy D/C IV fluids Discharge home Follow up in 2 weeks at [facility] Follow up with OLIN,Evey Mcmahan D in 2 weeks.  Contact information:  [facility] [address] [place] [address] [phone]    Obese (BMI 30-39.9) Estimated body mass index is 32.33 kg/m as calculated from the following:   Height as of this encounter: 6' (1.829 m).   Weight as of this encounter: 108.1 kg. Patient also counseled that weight may inhibit the healing process Patient counseled that losing weight will help with future health issues        Anastasio Auerbach. Zev Blue   PAC  06/02/2019, 8:18 AM

## 2019-06-02 NOTE — Progress Notes (Signed)
Physical Therapy Treatment Patient Details Name: Kenneth Yoder MRN: [id number] DOB: Jul 08, 1970 Today's Date: 06/02/2019    History of Present Illness Patient is 49 y.o. male s/p Lt THA anterior approach on 06/01/19 with PMH significant for Rt THA in 2019 and h/o trauma to bil hips while in [employer], history of PTSD (pain is a trigger), history of plantar fasciatis, and depression.    PT Comments    POD # 1 am session General bed mobility comments: demonstarted and instructed how to use a belt to self assist LE off bed.  General transfer comment: 50% VC's on proper hand placement and increased time to rise and lower due to pain.  General Gait Details: increased time with increased c/o dizziness. BP and HR WNL.  (?Oxy effects)  pt able to tolerate with decreased gait speed. General stair comments: up 2 steps forward with walker 75% VC's on proper walker placement and proper sequencing. Then returned to room to perform some TE's following HEP handout.  Instructed on proper tech, freq as well as use of ICE.   Pt will need another PT session to perform stairs again and complete HEP.   Follow Up Recommendations  Follow surgeon's recommendation for DC plan and follow-up therapies     Equipment Recommendations  None recommended by PT    Recommendations for Other Services       Precautions / Restrictions Precautions Precautions: Fall Restrictions Weight Bearing Restrictions: No LLE Weight Bearing: Weight bearing as tolerated    Mobility  Bed Mobility Overal bed mobility: Needs Assistance Bed Mobility: Supine to Sit           General bed mobility comments: demonstarted and instructed how to use a belt to self assist LE off bed  Transfers Overall transfer level: Needs assistance Equipment used: Rolling walker (2 wheeled) Transfers: Sit to/from Stand Sit to Stand: Supervision;Min guard         General transfer comment: 50% VC's on proper hand placement and increased time to  rise and lower due to pain  Ambulation/Gait Ambulation/Gait assistance: Min guard;Min assist Gait Distance (Feet): 25 Feet Assistive device: Rolling walker (2 wheeled) Gait Pattern/deviations: Step-to pattern;Decreased stance time - left Gait velocity: decreased   General Gait Details: increased time with increased c/o dizziness. BP and HR WNL.  (?Oxy effects)  pt able to tolerate with decreased gait speed.   Stairs Stairs: Yes Stairs assistance: Min assist Stair Management: No rails;Forwards;With walker Number of Stairs: 2 General stair comments: up forward with walker 75% VC's on proper walker placement and proper sequencing.   Wheelchair Mobility    Modified Rankin (Stroke Patients Only)       Balance                                            Cognition Arousal/Alertness: Awake/alert Behavior During Therapy: WFL for tasks assessed/performed Overall Cognitive Status: Within Functional Limits for tasks assessed                                        Exercises   Total Hip Replacement TE's 10 reps ankle pumps 10 reps knee presses 10 reps heel slides 10 reps SAQ's 10 reps ABD Followed by ICE     General Comments  Pertinent Vitals/Pain Pain Assessment: 0-10 Pain Score: 8  Pain Location: Lt hip Pain Descriptors / Indicators: Grimacing;Guarding;Operative site guarding;Tender Pain Intervention(s): Monitored during session;Premedicated before session;Repositioned;Ice applied    Home Living                      Prior Function            PT Goals (current goals can now be found in the care plan section) Progress towards PT goals: Progressing toward goals    Frequency    7X/week      PT Plan Current plan remains appropriate    Co-evaluation              AM-PAC PT "6 Clicks" Mobility   Outcome Measure  Help needed turning from your back to your side while in a flat bed without using  bedrails?: A Little Help needed moving from lying on your back to sitting on the side of a flat bed without using bedrails?: A Little Help needed moving to and from a bed to a chair (including a wheelchair)?: A Little Help needed standing up from a chair using your arms (e.g., wheelchair or bedside chair)?: A Little Help needed to walk in hospital room?: A Little Help needed climbing 3-5 steps with a railing? : A Little 6 Click Score: 18    End of Session Equipment Utilized During Treatment: Gait belt Activity Tolerance: Patient tolerated treatment well Patient left: in chair;with call bell/phone within reach Nurse Communication: Mobility status PT Visit Diagnosis: Muscle weakness (generalized) (M62.81);Difficulty in walking, not elsewhere classified (R26.2)     Time: 5916-3846 PT Time Calculation (min) (ACUTE ONLY): 40 min  Charges:  $Gait Training: 8-22 mins $Therapeutic Exercise: 8-22 mins $Therapeutic Activity: 8-22 mins                     Rica Koyanagi  PTA Acute  Rehabilitation Services Pager      [phone] Office      [phone]

## 2019-06-02 NOTE — Progress Notes (Signed)
Physical Therapy Treatment Patient Details Name: Kenneth Yoder MRN: [id number] DOB: 1971-02-05 Today's Date: 06/02/2019    History of Present Illness Patient is 49 y.o. male s/p Lt THA anterior approach on 06/01/19 with PMH significant for Rt THA in 2019 and h/o trauma to bil hips while in [employer], history of PTSD (pain is a trigger), history of plantar fasciatis, and depression.    PT Comments    POD # 1 pm session Assisted with amb a greater distance with decreased c/o dizziness, practiced stairs again Then returned to room to perform some TE's following HEP handout.  Instructed on proper tech, freq as well as use of ICE.   Addressed all mobility questions, discussed appropriate activity, educated on use of ICE.  Pt ready for D/C to home.   Follow Up Recommendations  Follow surgeon's recommendation for DC plan and follow-up therapies     Equipment Recommendations  None recommended by PT    Recommendations for Other Services       Precautions / Restrictions Precautions Precautions: Fall Restrictions Weight Bearing Restrictions: No LLE Weight Bearing: Weight bearing as tolerated    Mobility  Bed Mobility Overal bed mobility: Needs Assistance Bed Mobility: Supine to Sit           General bed mobility comments: OOB in recliner  Transfers Overall transfer level: Needs assistance Equipment used: Rolling walker (2 wheeled) Transfers: Sit to/from Stand Sit to Stand: Supervision;Min guard         General transfer comment: 50% VC's on proper hand placement and increased time to rise and lower due to pain  Ambulation/Gait Ambulation/Gait assistance: Min guard;Min assist Gait Distance (Feet): 25 Feet Assistive device: Rolling walker (2 wheeled) Gait Pattern/deviations: Step-to pattern;Decreased stance time - left Gait velocity: decreased   General Gait Details: tolerated an increased distance with minimal c/o dizziness   Stairs Stairs: Yes Stairs  assistance: Min assist Stair Management: No rails;Forwards;With walker Number of Stairs: 2 General stair comments: up forward with walker 75% VC's on proper walker placement and proper sequencing.   Wheelchair Mobility    Modified Rankin (Stroke Patients Only)       Balance                                            Cognition Arousal/Alertness: Awake/alert Behavior During Therapy: WFL for tasks assessed/performed Overall Cognitive Status: Within Functional Limits for tasks assessed                                        Exercises  5 reps all standing TE's following HEP.  Instructed on proper tech, freq as well as use of ICE.     General Comments        Pertinent Vitals/Pain Pain Assessment: 0-10 Pain Score: 8  Pain Location: Lt hip Pain Descriptors / Indicators: Grimacing;Guarding;Operative site guarding;Tender Pain Intervention(s): Monitored during session;Premedicated before session;Repositioned;Ice applied    Home Living                      Prior Function            PT Goals (current goals can now be found in the care plan section) Progress towards PT goals: Progressing toward goals    Frequency    7X/week  PT Plan Current plan remains appropriate    Co-evaluation              AM-PAC PT "6 Clicks" Mobility   Outcome Measure  Help needed turning from your back to your side while in a flat bed without using bedrails?: A Little Help needed moving from lying on your back to sitting on the side of a flat bed without using bedrails?: A Little Help needed moving to and from a bed to a chair (including a wheelchair)?: A Little Help needed standing up from a chair using your arms (e.g., wheelchair or bedside chair)?: A Little Help needed to walk in hospital room?: A Little Help needed climbing 3-5 steps with a railing? : A Little 6 Click Score: 18    End of Session Equipment Utilized During  Treatment: Gait belt Activity Tolerance: Patient tolerated treatment well Patient left: in chair;with call bell/phone within reach Nurse Communication: Mobility status PT Visit Diagnosis: Muscle weakness (generalized) (M62.81);Difficulty in walking, not elsewhere classified (R26.2)     Time: 1105-1130 PT Time Calculation (min) (ACUTE ONLY): 25 min  Charges:  $Gait Training: 8-22 mins $Therapeutic Exercise: 8-22 mins $Therapeutic Activity: 8-22 mins                     Felecia Shelling  PTA Acute  Rehabilitation Services Pager      [phone] Office      [phone]

## 2019-06-03 NOTE — Anesthesia Postprocedure Evaluation (Signed)
Anesthesia Post Note  Patient: Efosa Treichler  Procedure(s) Performed: TOTAL HIP ARTHROPLASTY ANTERIOR APPROACH (Left Hip)     Patient location during evaluation: PACU Anesthesia Type: Spinal Level of consciousness: oriented and awake and alert Pain management: pain level controlled Vital Signs Assessment: post-procedure vital signs reviewed and stable Respiratory status: spontaneous breathing, respiratory function stable and patient connected to nasal cannula oxygen Cardiovascular status: blood pressure returned to baseline and stable Postop Assessment: no headache, no backache and no apparent nausea or vomiting Anesthetic complications: no    Last Vitals:  Vitals:   06/02/19 0946 06/02/19 0948  BP: (!) 139/94 (!) 139/94  Pulse:  91  Resp:    Temp:    SpO2:  100%    Last Pain:  Vitals:   06/02/19 1200  TempSrc:   PainSc: 3                  Burna Atlas S

## 2019-06-08 NOTE — Discharge Summary (Signed)
Physician Discharge Summary  Patient ID: Kenneth Yoder MRN: [id number] DOB/AGE: Sep 28, 1970 49 y.o.  Admit date: 06/01/2019 Discharge date: 06/02/2019   Procedures:  Procedure(s) (LRB): TOTAL HIP ARTHROPLASTY ANTERIOR APPROACH (Left)  Attending Physician:  Dr. Paralee Cancel   Admission Diagnoses:   Left hip primary OA / pain  Discharge Diagnoses:  Principal Problem:   S/P left THA, AA  Past Medical History:  Diagnosis Date  . Arthritis   . Chronic hip pain   . Depression   . Plantar fasciitis 03/2019   bilateral    HPI:    Kenneth Yoder, 49 y.o. male, has a history of pain and functional disability in the left hip(s) due to trauma and arthritis and patient has failed non-surgical conservative treatments for greater than 12 weeks to include NSAID's and/or analgesics, corticosteriod injections and activity modification.  Onset of symptoms was gradual starting 14 years ago with gradually worsening course since that time.The patient noted prior procedures of the hip to include arthroplasty on the right hip(s).  Patient currently rates pain in the left hip at 9 out of 10 with activity. Patient has night pain, worsening of pain with activity and weight bearing, trendelenberg gait, pain that interfers with activities of daily living and pain with passive range of motion. Patient has evidence of periarticular osteophytes and joint space narrowing by imaging studies. This condition presents safety issues increasing the risk of falls.  There is no current active infection.   Risks, benefits and expectations were discussed with the patient.  Risks including but not limited to the risk of anesthesia, blood clots, nerve damage, blood vessel damage, failure of the prosthesis, infection and up to and including death.  Patient understand the risks, benefits and expectations and wishes to proceed with surgery.   PCP: [facility]   Discharged Condition: good  Hospital Course:   Patient underwent the above stated procedure on 06/01/2019. Patient tolerated the procedure well and brought to the recovery room in good condition and subsequently to the floor.  POD #1 BP: 139/81 ; Pulse: 83 ; Temp: 98.2 F (36.8 C) ; Resp: 18 Patient reports pain as mild, pain controlled. Other than not sleeping well, no other reported events throughout the night. Discussed the procedure, findings and expectations moving forward. Ready to be discharged home. Dorsiflexion/plantar flexion intact, incision: dressing C/D/I, no cellulitis present and compartment soft.    LABS  Basename    HGB     14.6  HCT     42.5    Discharge Exam: General appearance: alert, cooperative and no distress Extremities: Homans sign is negative, no sign of DVT, no edema, redness or tenderness in the calves or thighs and no ulcers, gangrene or trophic changes  Disposition: Home with follow up in 2 weeks   Follow-up Information    Paralee Cancel, MD. Schedule an appointment as soon as possible for a visit in 2 weeks.   Specialty: Orthopedic Surgery Contact information: [address] [place] [address] [phone]           Discharge Instructions    Call MD / Call 911   Complete by: As directed    If you experience chest pain or shortness of breath, CALL 911 and be transported to the hospital emergency room.  If you develope a fever above 101 F, pus (white drainage) or increased drainage or redness at the wound, or calf pain, call your surgeon's office.   Change dressing   Complete by:  As directed    Maintain surgical dressing until follow up in the clinic. If the edges start to pull up, may reinforce with tape. If the dressing is no longer working, may remove and cover with gauze and tape, but must keep the area dry and clean.  Call with any questions or concerns.   Constipation Prevention   Complete by: As directed    Drink plenty of fluids.  Prune juice may be helpful.  You may  use a stool softener, such as Colace (over the counter) 100 mg twice a day.  Use MiraLax (over the counter) for constipation as needed.   Diet - low sodium heart healthy   Complete by: As directed    Discharge instructions   Complete by: As directed    Maintain surgical dressing until follow up in the clinic. If the edges start to pull up, may reinforce with tape. If the dressing is no longer working, may remove and cover with gauze and tape, but must keep the area dry and clean.  Follow up in 2 [facility] at [facility]. Call with any questions or concerns.   Increase activity slowly as tolerated   Complete by: As directed    Weight bearing as tolerated with assist device (walker, cane, etc) as directed, use it as long as suggested by your surgeon or therapist, typically at least 4-6 weeks.   TED hose   Complete by: As directed    Use stockings (TED hose) for 2 weeks on both leg(s).  You may remove them at night for sleeping.      Allergies as of 06/02/2019      Reactions   Etodolac Other (See Comments)   Bloody stools   Sulindac Other (See Comments)   Bloody stools      Medication List    STOP taking these medications   multivitamin with minerals Tabs tablet   OVER THE COUNTER MEDICATION     TAKE these medications   aspirin 81 MG chewable tablet Commonly known as: Aspirin Childrens Chew 1 tablet (81 mg total) by mouth 2 (two) times daily for 28 days.   docusate sodium 100 MG capsule Commonly known as: Colace Take 1 capsule (100 mg total) by mouth 2 (two) times daily.   methocarbamol 500 MG tablet Commonly known as: Robaxin Take 1 tablet (500 mg total) by mouth every 6 (six) hours as needed for muscle spasms.   oxyCODONE 5 MG immediate release tablet Commonly known as: Roxicodone Take 1-2 tablets (5-10 mg total) by mouth every 4 (four) hours as needed for moderate pain or severe pain.   pantoprazole 40 MG tablet Commonly known as: PROTONIX Take 40 mg by mouth daily before  breakfast. 30 minutes prior   polyethylene glycol 17 g packet Commonly known as: MIRALAX / GLYCOLAX Take 17 g by mouth 2 (two) times daily.            Discharge Care Instructions  (From admission, onward)         Start     Ordered   06/01/19 0000  Change dressing    Comments: Maintain surgical dressing until follow up in the clinic. If the edges start to pull up, may reinforce with tape. If the dressing is no longer working, may remove and cover with gauze and tape, but must keep the area dry and clean.  Call with any questions or concerns.   06/01/19 1003           Signed: Anastasio Auerbach.  Alex Leahy   PA-C  06/08/2019, 11:13 AM

## 2020-06-03 IMAGING — RF DG C-ARM 1-60 MIN-NO REPORT
1 series · 2 of 2 positions shown · non-contrast
Comparison: July 08, 2017.

CLINICAL DATA: Left hip replacement.

EXAM:
OPERATIVE left HIP (WITH PELVIS IF PERFORMED) 2 VIEWS
TECHNIQUE: Fluoroscopic spot image(s) were submitted for interpretation
post-operatively.
Radiation exposure index: 2.8963 mGy.

[Series 1: unknown protocol · 0.20mm/px · 2 of 2 slices shown]
[im 1/2]
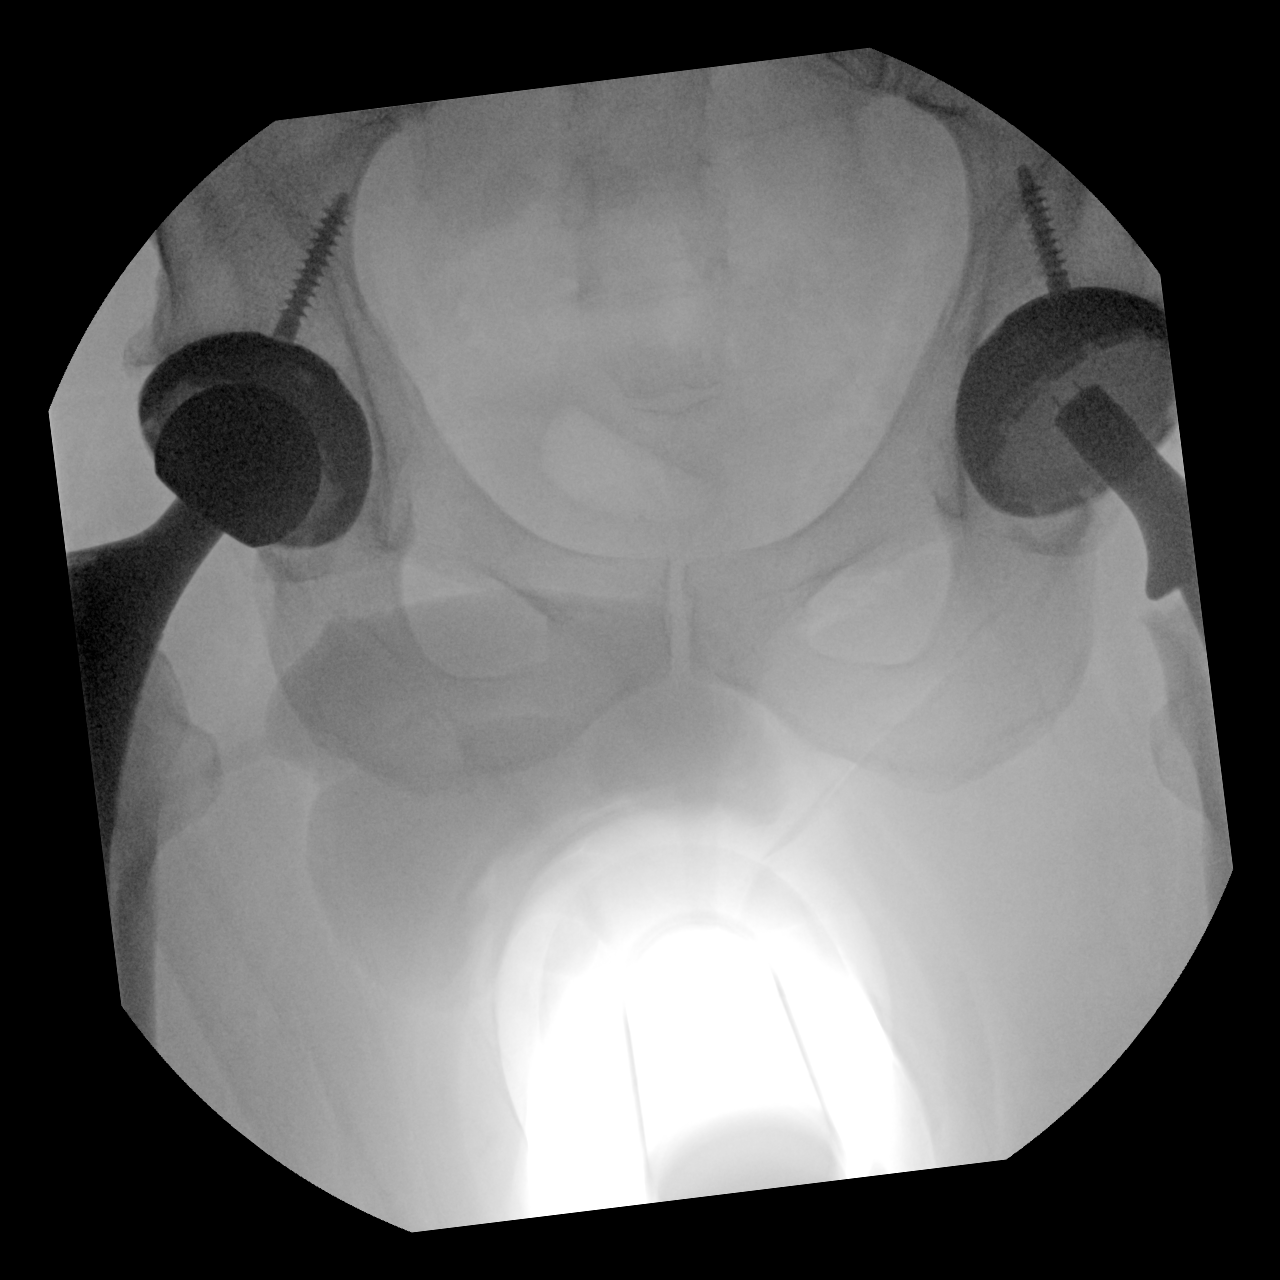
[im 2/2]
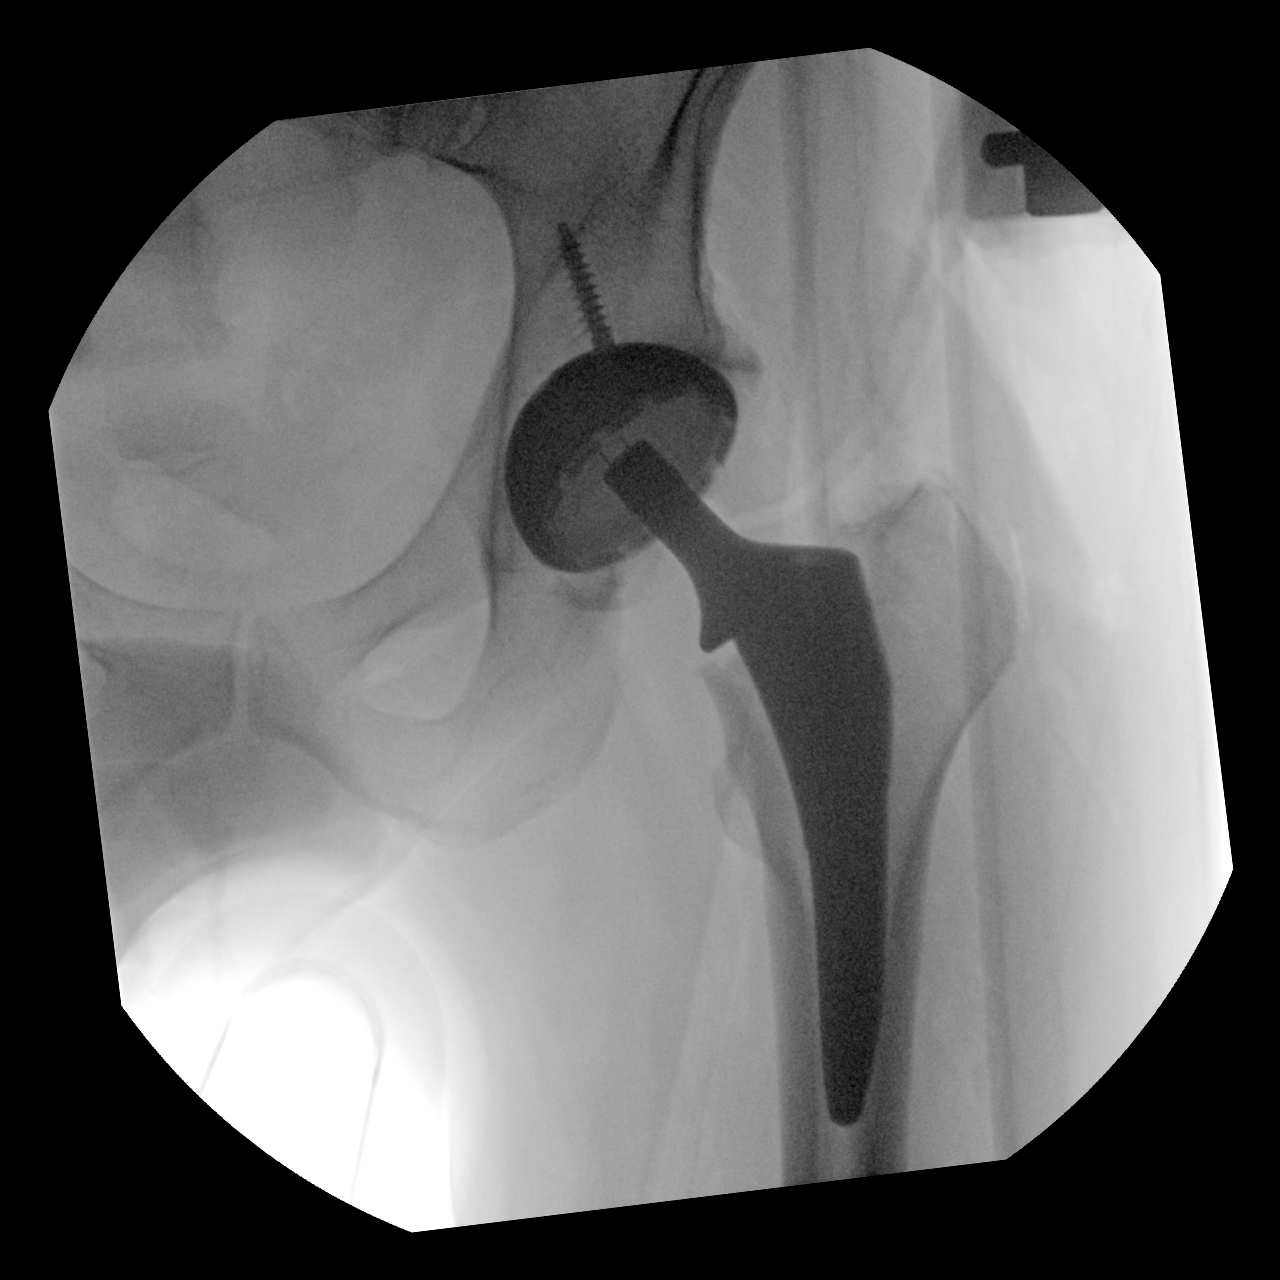

[2 of 2 positions shown; findings below may reference images not displayed]

FINDINGS: The left femoral and acetabular components appear to be well
situated.
IMPRESSION: Fluoroscopic guidance was provided during left total hip
arthroplasty.

## 2020-06-03 IMAGING — RF DG HIP (WITH PELVIS) OPERATIVE*L*
1 series · 2 of 2 positions shown · non-contrast
Comparison: July 08, 2017.

CLINICAL DATA: Left hip replacement.

EXAM:
OPERATIVE left HIP (WITH PELVIS IF PERFORMED) 2 VIEWS
TECHNIQUE: Fluoroscopic spot image(s) were submitted for interpretation
post-operatively.
Radiation exposure index: 2.8963 mGy.

[Series 1: unknown protocol · 0.20mm/px · 2 of 2 slices shown]
[im 1/2]
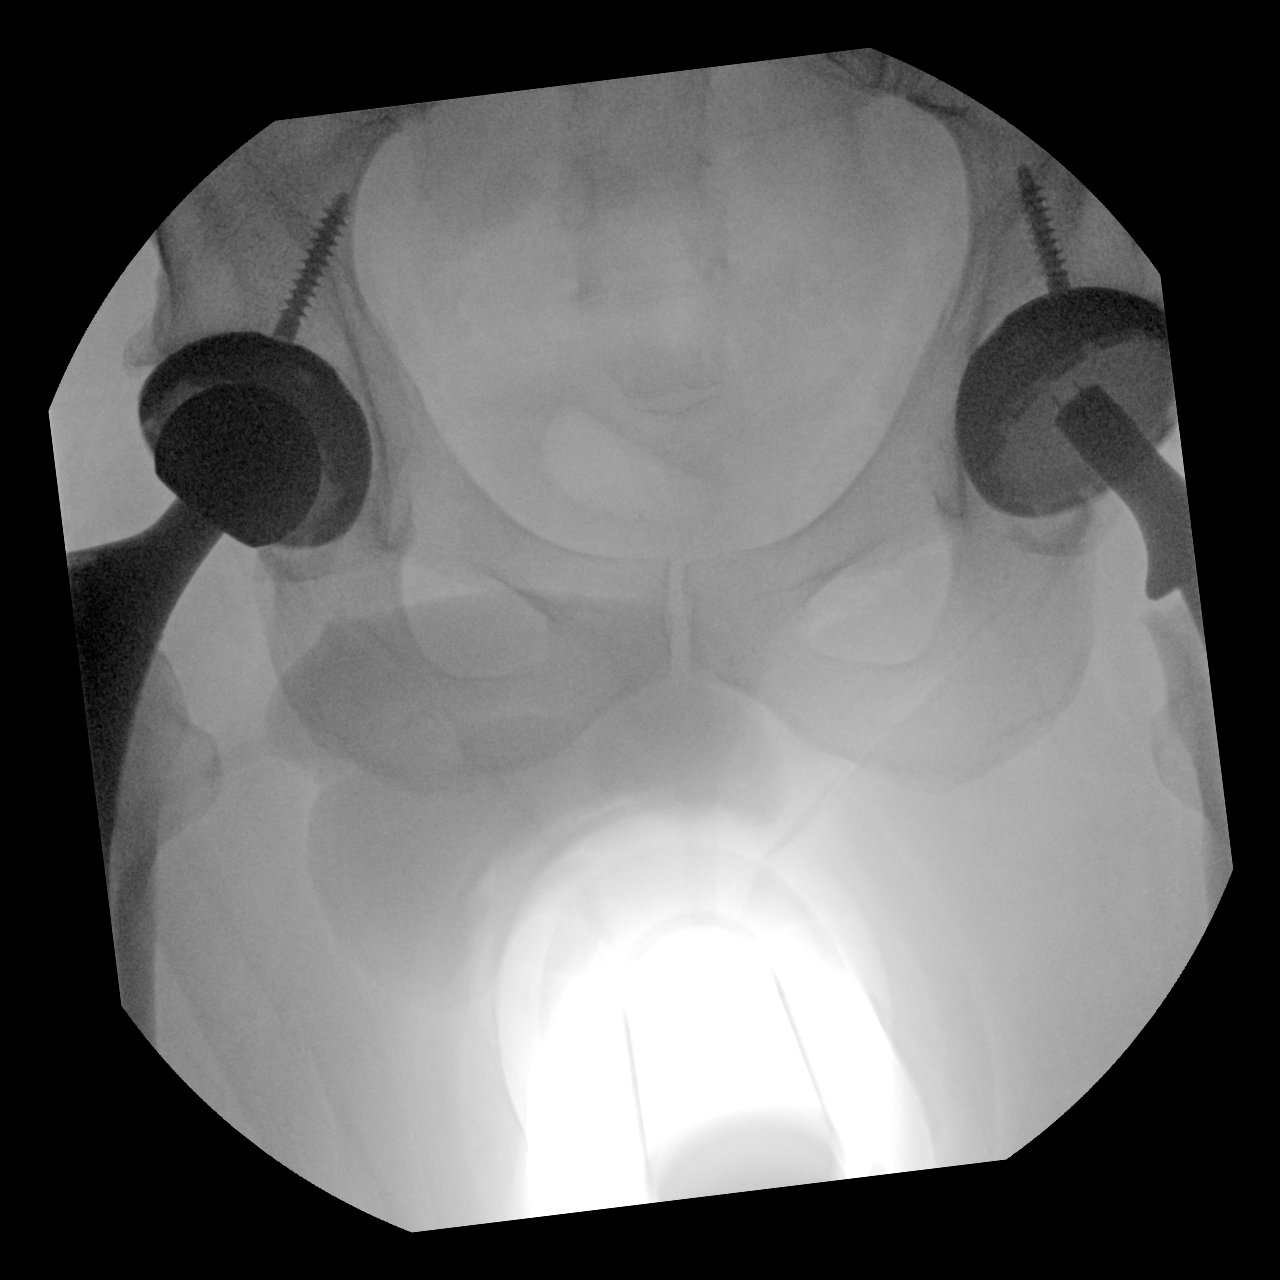
[im 2/2]
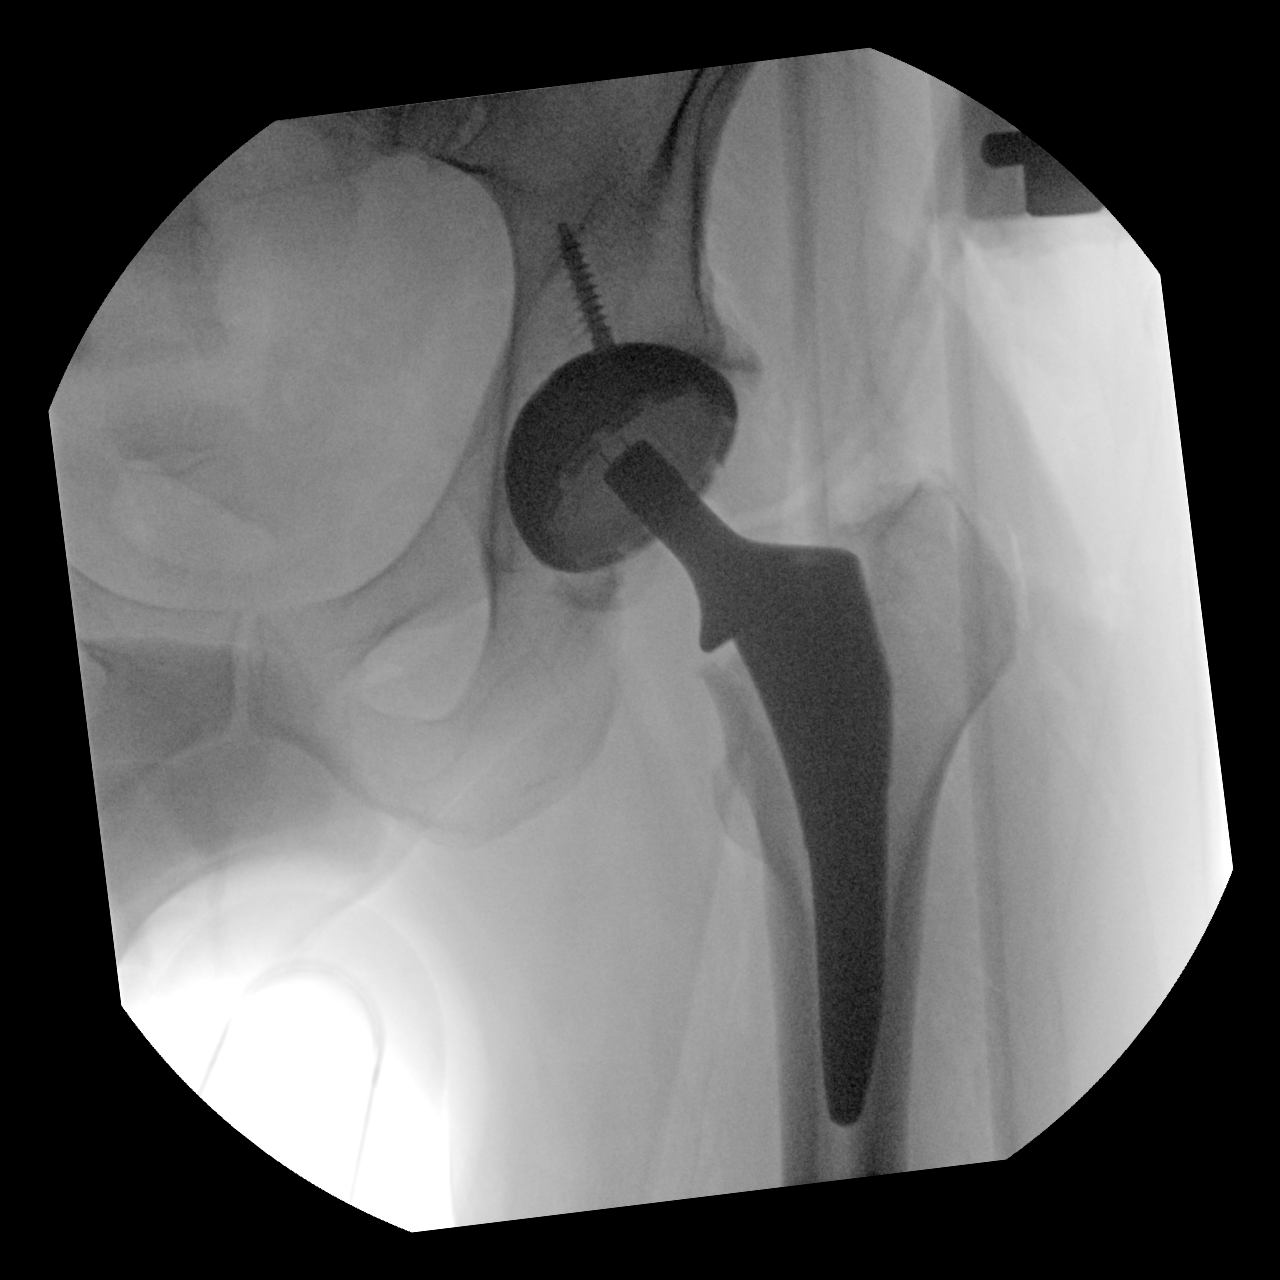

[2 of 2 positions shown; findings below may reference images not displayed]

FINDINGS: The left femoral and acetabular components appear to be well
situated.
IMPRESSION: Fluoroscopic guidance was provided during left total hip
arthroplasty.
